# Patient Record
Sex: Female | Born: 1980 | Race: White | Hispanic: No | Marital: Married | Smoking: Former smoker
Health system: Southern US, Community
[De-identification: ages and names within clinical notes are randomized; demographics above are authoritative.]

## PROBLEM LIST (undated history)

## (undated) DIAGNOSIS — Z87440 Personal history of urinary (tract) infections: Secondary | ICD-10-CM

## (undated) DIAGNOSIS — N200 Calculus of kidney: Secondary | ICD-10-CM

## (undated) HISTORY — DX: Calculus of kidney: N20.0

## (undated) HISTORY — PX: LEEP: SHX91

## (undated) HISTORY — PX: DILATION AND CURETTAGE OF UTERUS: SHX78

## (undated) HISTORY — DX: Personal history of urinary (tract) infections: Z87.440

## (undated) HISTORY — PX: WISDOM TOOTH EXTRACTION: SHX21

---

## 2004-04-22 ENCOUNTER — Encounter (INDEPENDENT_AMBULATORY_CARE_PROVIDER_SITE_OTHER): Payer: Self-pay | Admitting: *Deleted

## 2004-04-22 ENCOUNTER — Ambulatory Visit (HOSPITAL_COMMUNITY): Admission: RE | Admit: 2004-04-22 | Discharge: 2004-04-22 | Payer: Self-pay | Admitting: Gynecology

## 2004-11-15 ENCOUNTER — Ambulatory Visit: Payer: Self-pay

## 2005-09-07 ENCOUNTER — Ambulatory Visit: Payer: Self-pay | Admitting: Obstetrics & Gynecology

## 2005-09-07 ENCOUNTER — Encounter: Payer: Self-pay | Admitting: Obstetrics & Gynecology

## 2005-10-05 ENCOUNTER — Ambulatory Visit: Payer: Self-pay | Admitting: Obstetrics & Gynecology

## 2005-10-06 ENCOUNTER — Emergency Department: Payer: Self-pay

## 2005-10-09 ENCOUNTER — Ambulatory Visit: Payer: Self-pay | Admitting: Gynecology

## 2005-11-02 ENCOUNTER — Ambulatory Visit: Payer: Self-pay | Admitting: Obstetrics & Gynecology

## 2005-11-23 ENCOUNTER — Ambulatory Visit (HOSPITAL_COMMUNITY): Admission: RE | Admit: 2005-11-23 | Discharge: 2005-11-23 | Payer: Self-pay | Admitting: Gynecology

## 2005-12-05 ENCOUNTER — Ambulatory Visit: Payer: Self-pay | Admitting: Family Medicine

## 2006-01-02 ENCOUNTER — Ambulatory Visit: Payer: Self-pay | Admitting: Family Medicine

## 2006-01-30 ENCOUNTER — Ambulatory Visit: Payer: Self-pay | Admitting: Family Medicine

## 2006-02-13 ENCOUNTER — Ambulatory Visit: Payer: Self-pay | Admitting: Family Medicine

## 2006-02-14 ENCOUNTER — Ambulatory Visit: Payer: Self-pay | Admitting: Family Medicine

## 2006-02-27 ENCOUNTER — Ambulatory Visit: Payer: Self-pay | Admitting: Family Medicine

## 2006-03-26 ENCOUNTER — Ambulatory Visit: Payer: Self-pay | Admitting: Family Medicine

## 2006-04-03 ENCOUNTER — Ambulatory Visit: Payer: Self-pay | Admitting: Gynecology

## 2006-04-10 ENCOUNTER — Ambulatory Visit: Payer: Self-pay | Admitting: Family Medicine

## 2006-04-17 ENCOUNTER — Ambulatory Visit: Payer: Self-pay | Admitting: Family Medicine

## 2006-04-24 ENCOUNTER — Ambulatory Visit: Payer: Self-pay | Admitting: Family Medicine

## 2006-04-24 ENCOUNTER — Ambulatory Visit (HOSPITAL_COMMUNITY): Admission: RE | Admit: 2006-04-24 | Discharge: 2006-04-24 | Payer: Self-pay | Admitting: Family Medicine

## 2006-04-27 ENCOUNTER — Ambulatory Visit: Payer: Self-pay | Admitting: Family Medicine

## 2006-04-29 ENCOUNTER — Inpatient Hospital Stay (HOSPITAL_COMMUNITY): Admission: AD | Admit: 2006-04-29 | Discharge: 2006-04-30 | Payer: Self-pay | Admitting: Gynecology

## 2006-04-29 ENCOUNTER — Ambulatory Visit: Payer: Self-pay | Admitting: Gynecology

## 2006-06-04 ENCOUNTER — Encounter (INDEPENDENT_AMBULATORY_CARE_PROVIDER_SITE_OTHER): Payer: Self-pay | Admitting: Gynecology

## 2006-06-04 ENCOUNTER — Ambulatory Visit: Payer: Self-pay | Admitting: Gynecology

## 2006-10-04 ENCOUNTER — Ambulatory Visit: Payer: Self-pay | Admitting: Family Medicine

## 2008-02-03 ENCOUNTER — Ambulatory Visit: Payer: Self-pay | Admitting: Obstetrics and Gynecology

## 2008-02-03 ENCOUNTER — Encounter: Payer: Self-pay | Admitting: Obstetrics and Gynecology

## 2008-03-02 ENCOUNTER — Encounter: Payer: Self-pay | Admitting: Family Medicine

## 2008-03-02 ENCOUNTER — Ambulatory Visit: Payer: Self-pay | Admitting: Obstetrics and Gynecology

## 2008-03-02 LAB — CONVERTED CEMR LAB
ALT: 10 units/L (ref 0–35)
AST: 13 units/L (ref 0–37)
Albumin: 4.4 g/dL (ref 3.5–5.2)
BUN: 11 mg/dL (ref 6–23)
Chloride: 103 meq/L (ref 96–112)
Cholesterol: 121 mg/dL (ref 0–200)
Creatinine, Ser: 0.64 mg/dL (ref 0.40–1.20)
Hemoglobin: 13.3 g/dL (ref 12.0–15.0)
LDL Cholesterol: 62 mg/dL (ref 0–99)
MCV: 90 fL (ref 78.0–100.0)
RBC: 4.72 M/uL (ref 3.87–5.11)
RDW: 12.8 % (ref 11.5–15.5)
Sodium: 141 meq/L (ref 135–145)
Total Bilirubin: 0.5 mg/dL (ref 0.3–1.2)
Total CHOL/HDL Ratio: 2.9
Triglycerides: 87 mg/dL (ref <150)
VLDL: 17 mg/dL (ref 0–40)
WBC: 5.7 10*3/uL (ref 4.0–10.5)

## 2008-05-07 ENCOUNTER — Encounter: Payer: Self-pay | Admitting: Family Medicine

## 2008-05-07 ENCOUNTER — Ambulatory Visit: Payer: Self-pay | Admitting: Obstetrics & Gynecology

## 2008-05-07 LAB — CONVERTED CEMR LAB
Basophils Absolute: 0 10*3/uL (ref 0.0–0.1)
Hemoglobin: 12.9 g/dL (ref 12.0–15.0)
Lymphs Abs: 1.8 10*3/uL (ref 0.7–4.0)
MCHC: 34.6 g/dL (ref 30.0–36.0)
MCV: 84.8 fL (ref 78.0–100.0)
Monocytes Relative: 4 % (ref 3–12)
RBC: 4.4 M/uL (ref 3.87–5.11)
RDW: 12.7 % (ref 11.5–15.5)
WBC: 10.1 10*3/uL (ref 4.0–10.5)

## 2008-05-13 ENCOUNTER — Ambulatory Visit (HOSPITAL_COMMUNITY): Admission: RE | Admit: 2008-05-13 | Discharge: 2008-05-13 | Payer: Self-pay | Admitting: Family Medicine

## 2008-05-26 ENCOUNTER — Ambulatory Visit: Payer: Self-pay | Admitting: Obstetrics & Gynecology

## 2008-05-26 ENCOUNTER — Encounter: Payer: Self-pay | Admitting: Family Medicine

## 2008-05-26 LAB — CONVERTED CEMR LAB
Chlamydia, DNA Probe: NEGATIVE
GC Probe Amp, Genital: NEGATIVE

## 2008-05-28 ENCOUNTER — Ambulatory Visit: Payer: Self-pay | Admitting: Obstetrics and Gynecology

## 2008-06-17 ENCOUNTER — Ambulatory Visit: Payer: Self-pay | Admitting: Obstetrics & Gynecology

## 2008-06-24 ENCOUNTER — Ambulatory Visit (HOSPITAL_COMMUNITY): Admission: RE | Admit: 2008-06-24 | Discharge: 2008-06-24 | Payer: Self-pay | Admitting: Obstetrics and Gynecology

## 2008-07-15 ENCOUNTER — Ambulatory Visit: Payer: Self-pay | Admitting: Family Medicine

## 2008-08-13 ENCOUNTER — Ambulatory Visit: Payer: Self-pay | Admitting: Obstetrics & Gynecology

## 2008-08-27 ENCOUNTER — Encounter: Payer: Self-pay | Admitting: Family Medicine

## 2008-08-27 ENCOUNTER — Ambulatory Visit: Payer: Self-pay | Admitting: Obstetrics and Gynecology

## 2008-08-27 LAB — CONVERTED CEMR LAB
HCT: 33.7 % — ABNORMAL LOW (ref 36.0–46.0)
RDW: 14.2 % (ref 11.5–15.5)
WBC: 11.8 10*3/uL — ABNORMAL HIGH (ref 4.0–10.5)

## 2008-09-09 ENCOUNTER — Encounter: Payer: Self-pay | Admitting: Family Medicine

## 2008-09-10 ENCOUNTER — Ambulatory Visit: Payer: Self-pay | Admitting: Nurse Practitioner

## 2008-09-16 ENCOUNTER — Ambulatory Visit: Payer: Self-pay | Admitting: Obstetrics and Gynecology

## 2008-10-01 ENCOUNTER — Ambulatory Visit: Payer: Self-pay | Admitting: Obstetrics and Gynecology

## 2008-10-05 ENCOUNTER — Ambulatory Visit: Payer: Self-pay | Admitting: Obstetrics & Gynecology

## 2008-10-15 ENCOUNTER — Ambulatory Visit: Payer: Self-pay | Admitting: Obstetrics & Gynecology

## 2008-10-28 ENCOUNTER — Ambulatory Visit: Payer: Self-pay | Admitting: Obstetrics & Gynecology

## 2008-10-28 ENCOUNTER — Encounter (INDEPENDENT_AMBULATORY_CARE_PROVIDER_SITE_OTHER): Payer: Self-pay | Admitting: *Deleted

## 2008-11-05 ENCOUNTER — Ambulatory Visit: Payer: Self-pay | Admitting: Obstetrics & Gynecology

## 2008-11-11 ENCOUNTER — Ambulatory Visit: Payer: Self-pay | Admitting: Family Medicine

## 2008-11-16 ENCOUNTER — Ambulatory Visit: Payer: Self-pay | Admitting: Family Medicine

## 2008-11-19 ENCOUNTER — Inpatient Hospital Stay (HOSPITAL_COMMUNITY): Admission: AD | Admit: 2008-11-19 | Discharge: 2008-11-22 | Payer: Self-pay | Admitting: Family Medicine

## 2008-11-19 ENCOUNTER — Ambulatory Visit: Payer: Self-pay | Admitting: Family

## 2008-12-31 ENCOUNTER — Ambulatory Visit: Payer: Self-pay | Admitting: Obstetrics & Gynecology

## 2009-11-17 ENCOUNTER — Ambulatory Visit: Payer: Self-pay | Admitting: Obstetrics & Gynecology

## 2010-06-24 LAB — CBC
HCT: 27.6 % — ABNORMAL LOW (ref 36.0–46.0)
HCT: 31.1 % — ABNORMAL LOW (ref 36.0–46.0)
Hemoglobin: 10.5 g/dL — ABNORMAL LOW (ref 12.0–15.0)
MCHC: 33.8 g/dL (ref 30.0–36.0)
MCV: 82.8 fL (ref 78.0–100.0)
MCV: 83.3 fL (ref 78.0–100.0)
Platelets: 260 10*3/uL (ref 150–400)
Platelets: 292 10*3/uL (ref 150–400)
RBC: 3.74 MIL/uL — ABNORMAL LOW (ref 3.87–5.11)
RDW: 15 % (ref 11.5–15.5)

## 2010-06-24 LAB — URINALYSIS, ROUTINE W REFLEX MICROSCOPIC
Glucose, UA: NEGATIVE mg/dL
Urobilinogen, UA: 0.2 mg/dL (ref 0.0–1.0)
pH: 5.5 (ref 5.0–8.0)

## 2010-06-24 LAB — URINE CULTURE: Special Requests: NEGATIVE

## 2010-06-24 LAB — CULTURE, BLOOD (ROUTINE X 2)
Culture: NO GROWTH
Culture: NO GROWTH

## 2010-06-24 LAB — RPR: RPR Ser Ql: NONREACTIVE

## 2010-06-24 LAB — URINE MICROSCOPIC-ADD ON

## 2010-08-02 NOTE — Assessment & Plan Note (Signed)
[name], [name]               ACCOUNT NO.:  [id number]   MEDICAL RECORD NO.:  [id number]          PATIENT TYPE:  POB   LOCATION:  [facility] at [facility]         FACILITY:  [facility]   PHYSICIAN:  Jaynie Collins, MD     DATE OF BIRTH:  11-28-1980   DATE OF SERVICE:  11/17/2009                                  CLINIC NOTE   REASON FOR VISIT:  Annual examination.   [name] is a 30 year old gravida 4, para 2-0-2-2 Caucasian female,  who is here today for her annual gynecologic examination.  The patient  has no concerns.  She denies any abnormal bleeding, vaginal discharge,  or any other gynecologic symptoms.   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  She has had uncomplicated  pregnancy.  On her gynecologic history use, the patient had had a LEEP  when she was 30 years old and since then has had normal Paps.  Her last  Pap smear was on February 03, 2008.  She has normal menstrual periods  which are regular.  Her first day of her last menstrual period was  October 23, 2009.  The patient is preparing to start trying to get  pregnant again.  She is currently on prenatal vitamins and is not on or  been exposed to any teratogenic agents.  She was using condoms for  contraception.   PAST MEDICAL HISTORY:  Obesity.   PAST SURGICAL HISTORY:  D and C x2 and LEEP.   MEDICATIONS:  Prenatal vitamins.   ALLERGIES:  No known drug allergies.   SOCIAL HISTORY:  The patient works as a Administrator, sports.  She denies any  tobacco, alcohol, or illicit drug use.   FAMILY HISTORY:  Mother has Parkinson.  Father had blood pressure  problems.  No gynecologic cancers.   REVIEW OF SYSTEMS:  Entirely negative.   PHYSICAL EXAMINATION:  VITAL SIGNS:  Pulse is 62, blood pressure 101/71,  weight 192 pounds, height is 5 feet 7 inches.  GENERAL:  No apparent distress.  HEENT:  Normocephalic, atraumatic.  NECK:  Supple.  No masses.   Normal thyroid.  BREASTS:  Soft, symmetric, nontender.  No abnormal masses, skin changes,  nipple drainage, or lymphadenopathy.  LUNGS:  Clear to auscultation bilaterally.  HEART:  Regular rate and rhythm.  ABDOMEN:  Soft, nontender, nondistended.  No organomegaly.  EXTREMITIES:  No cyanosis, clubbing, or edema.  PELVIC:  Normal external female genitalia.  Pink, well-rugated vagina.  Normal discharge.  Multiparous cervix.  Pap smear sample was obtained on  bimanual exam.  The patient does have a small mobile uterus.  No  tenderness and normal adnexa bilaterally.   ASSESSMENT AND PLAN:  The patient is a 30 year old gravida 4, para 2-0-2-  2 here for annual examination.  The patient has no gynecologic concerns.  Her Pap smear was done today.  We will follow up on the results.  The  patient was told to call back if she has any further gynecologic issues  or when she gets pregnant to establish her prenatal care.  Of note, the  patient is interested  in having a lipid panel checked.  She was told to  come back to the office when she is fasting to have this performed.           ______________________________  Jaynie Collins, MD     UA/MEDQ  D:  11/17/2009  T:  11/18/2009  Job:  [id number]

## 2010-08-02 NOTE — Assessment & Plan Note (Signed)
[name], [name]               ACCOUNT NO.:  [id number]   MEDICAL RECORD NO.:  [id number]          PATIENT TYPE:  POB   LOCATION:  [facility] at [facility]         FACILITY:  [facility]   PHYSICIAN:  Argentina Donovan, MD        DATE OF BIRTH:  12-21-1980   DATE OF SERVICE:  02/03/2008                                  CLINIC NOTE   The patient is a 30 year old Caucasian female gravida 1, para 1-0-0-1  with child a year and a half old, in for an annual wellness exam.  She  is on no medications except prenatal vitamins.   PHYSICAL EXAMINATION:  VITAL SIGNS:  Her height is 5 feet 6, she weighs  199 pounds.  Her blood pressure is 113/72 with a pulse of 66 and  temperature of 98.7.  HEENT:  PERRLA, normocephalic, within normal limits.  NECK:  Supple.  Thyroid symmetrical with no masses.  BACK:  Erect.  LUNGS:  Clear to auscultation and percussion.  HEART:  No murmur.  Normal sinus rhythm.  PMI in fifth intercostal space  and midclavicular line.  BREASTS:  Symmetrical with no dominant masses.  No nipple discharge.  ABDOMEN:  Soft, flat, nontender.  No masses or organomegaly.  EXTERNAL GENITALIA:  Normal.  BUS within normal limits.  The vagina is  clean and well rugated.  The cervix is clean and parous and the uterus  is of normal size, shape, consistency.  The adnexa could not be well  outlined because of the habitus of the patient.  RECTUM:  No masses.  EXTREMITIES:  No edema.  No varices.  NEUROLOGIC:  DTRs within normal limits.   IMPRESSION:  Normal physical examination.   PLAN:  To get a CMET, CBC, and urinalysis, and the patient will return  to that in the next few days because she has already eaten today and we  wanted to get some lipid profile.           ______________________________  Argentina Donovan, MD     PR/MEDQ  D:  02/03/2008  T:  02/04/2008  Job:  [id number]

## 2010-08-05 NOTE — Op Note (Signed)
NAMESEVILLA, [name]                ACCOUNT NO.:  [id number]   MEDICAL RECORD NO.:  [id number]          PATIENT TYPE:  AMB   LOCATION:  [facility]                           FACILITY:  [facility]   PHYSICIAN:  Ginger Carne, MD  DATE OF BIRTH:  10-Sep-1980   DATE OF PROCEDURE:  04/22/2004  DATE OF DISCHARGE:                                 OPERATIVE REPORT   PREOPERATIVE DIAGNOSIS:  First trimester missed abortion.   POSTOPERATIVE DIAGNOSIS:  First trimester missed abortion.   PROCEDURE:  Aspiration, dilatation and curettage.   SURGEON:  Ginger Carne, M.D.   ASSISTANT:  None.   COMPLICATIONS:  None immediate.   ESTIMATED BLOOD LOSS:  Negligible.   SPECIMENS:  Products of conception.   ANESTHESIA:  Local.   FINDINGS:  The uterus was about eight weeks in size consistent with  transvaginal ultrasound findings.  She is Rh positive.  Products of  conception noted in an enlarged cavity.  Both adnexa palpable and found to  be normal.  External genitalia, vulva, and vagina normal as well.  A #7  suction curet was utilized for the procedure followed by sharp and then  suction curettage.   DESCRIPTION OF PROCEDURE:  The patient was prepped and draped in the usual  fashion and placed in the lithotomy position.  Betadine solution used for  antiseptic and she was not catheterized.  After adequate sedation and  without general anesthetic, 10 mL of Marcaine was injected circumferentially  around the cervix.  Single tooth tenaculum placed on the anterior lip.  Dilatation to accommodate a #7 suction curettage followed by sharp and then  suction curettage followed.  The cavity was empty at the end of the  procedure.  The patient tolerated the procedure well and returned to the  postanesthesia recovery room in excellent condition.      SHB/MEDQ  D:  04/22/2004  T:  04/22/2004  Job:  [id number]

## 2010-08-05 NOTE — H&P (Signed)
NAMEJUDIT, [name]                ACCOUNT NO.:  [id number]   MEDICAL RECORD NO.:  [id number]           PATIENT TYPE:   LOCATION:                                 FACILITY:   PHYSICIAN:  Ginger Carne, MD  DATE OF BIRTH:  1980/06/12   DATE OF ADMISSION:  DATE OF DISCHARGE:                                HISTORY & PHYSICAL   ADMITTING DIAGNOSIS:  Missed abortion, first trimester.   IN-HOSPITAL PROCEDURE:  Aspiration dilatation and curettage.   HISTORY OF PRESENT ILLNESS:  This patient is a 30 year old gravida 1 para 38  Caucasian female; [facility] October 23, 2004; [redacted] weeks gestation; with a missed  abortion noted on transvaginal ultrasound today.  The patient was  complaining of minima spotting last evening and this morning when she  arrived to the office in late morning of April 21, 2004 ultrasound  confirmed no fetal heart rate with a measurement of about 8 weeks.  The  patient has had no other complaints.   OBSTETRICAL/GYNECOLOGICAL HISTORY:  She is O positive, rubella immune.  LMP  was uncertain.  Ultrasound on March 08, 2004 at 6-and-a-half weeks  confirmed her due date of October 23, 2004.   ALLERGIES:  None.   CURRENT MEDICAL PROBLEMS:  The patient is being treated for active  bronchitis for which she is taking Hycotuss and Z-Pak.   CURRENT MEDICATIONS:  Prenatal vitamins, Z-Pak, and Hycotuss.   SOCIAL HISTORY:  Negative for smoking, illicit drug abuse, or alcohol.   SURGICAL HISTORY:  Negative.   FAMILY HISTORY:  Negative for breast, colon, ovarian, or uterine carcinoma.   REVIEW OF SYSTEMS:  Negative.   PHYSICAL EXAMINATION:  GENERAL:  Normal-appearing female, no acute distress.  VITAL SIGNS:  Height 5 feet 6 inches weight 190 pounds.  Blood pressure  120/78.  HEENT:  Grossly normal.  BREAST:  Without masses, discharge, thickenings, or tenderness.  CHEST:  Clear to percussion and auscultation.  CARDIOVASCULAR:  Without murmurs or enlargements, regular rate and  rhythm.  EXTREMITY, LYMPHATIC, SKIN, NEUROLOGIC, MUSCULOSKELETAL:  All within normal  limits.  ABDOMEN:  Soft without gross hepatosplenomegaly.  PELVIC:  External genitalia, vulva, vagina normal.  Cervix smooth without  erosions or lesions.  Uterus palpates to about 8 weeks and confirmed by  ultrasound.  Both adnexa palpable, found to be normal.   IMPRESSION:  Transvaginal ultrasound diagnosis of first trimester missed  abortion.   PLAN:  Aspiration dilatation and curettage.      SHB/MEDQ  D:  04/21/2004  T:  04/21/2004  Job:  [id number]

## 2010-09-06 ENCOUNTER — Ambulatory Visit (INDEPENDENT_AMBULATORY_CARE_PROVIDER_SITE_OTHER): Payer: Self-pay | Admitting: Obstetrics & Gynecology

## 2010-09-06 DIAGNOSIS — Z30430 Encounter for insertion of intrauterine contraceptive device: Secondary | ICD-10-CM

## 2010-10-04 ENCOUNTER — Encounter: Payer: Self-pay | Admitting: Family Medicine

## 2011-06-06 ENCOUNTER — Encounter: Payer: Self-pay | Admitting: Family Medicine

## 2011-06-06 ENCOUNTER — Ambulatory Visit (INDEPENDENT_AMBULATORY_CARE_PROVIDER_SITE_OTHER): Payer: BC Managed Care – PPO | Admitting: Family Medicine

## 2011-06-06 ENCOUNTER — Ambulatory Visit: Payer: Self-pay | Admitting: Family Medicine

## 2011-06-06 VITALS — BP 106/76 | HR 65 | Ht 67.0 in | Wt 201.0 lb

## 2011-06-06 DIAGNOSIS — Z30432 Encounter for removal of intrauterine contraceptive device: Secondary | ICD-10-CM

## 2011-06-06 NOTE — Progress Notes (Signed)
History Wants IUD removed, has had weight gain and cramping.  May want more kids, husband may get vasectomy. Physical exam Filed Vitals:   06/06/11 1449  BP: 106/76  Pulse: 65  GU-NEFG, BUS WNL, Vagina pink and ruggated, Cervix without lesion, IUD strings visualized and removed easily   Assessment IUD removal  Plan See AVS

## 2011-06-06 NOTE — Patient Instructions (Signed)
Preparing for Pregnancy Preparing for pregnancy (preconceptual care) by getting counseling and information from your caregiver before getting pregnant is a good idea. It will help you and your baby have a better chance to have a healthy, safe pregnancy and delivery of your baby. Make an appointment with your caregiver to talk about your health, medical, and family history and how to prepare yourself before getting pregnant. Your caregiver will do a complete physical exam and a Pap test. They will want to know:  About you, your spouse or partner, and your family's medical and genetic history.   If you are eating a balanced diet and drinking enough fluids.   What vitamins and mineral supplements you are taking. This includes taking folic acid before getting pregnant to help prevent birth defects.   What medications you are taking including prescription, over-the-counter and herbal medications.   If there is any substance abuse like alcohol, smoking, and illegal drugs.   If there is any mental or physical domestic violence.   If there is any risk of sexually transmitted disease between you and your partner.   What immunizations and vaccinations you have had and what you may need before getting pregnant.   If you should get tested for HIV infection.   If there is any exposure to chemical or toxic substances at home or work.   If there are medical problems you have that need to be treated and kept under control before getting pregnant such as diabetes, high blood pressure or others.   If there were any past surgeries, pregnancies and problems with them.   What your current weight is and to set a goal as to how much weight you should gain while pregnant. Also, they will check if you should lose or gain weight before getting pregnant.   What is your exercise routine and what it is safe when you are pregnant.   If there are any physical disabilities that need to be addressed.   About spacing  your pregnancies when there are other children.   If there is a financial problem that may affect you having a child.  After talking about the above points with your caregiver, your caregiver will give you advice on how to help treat and work with you on solving any issues, if necessary, before getting pregnant. The goal is to have a healthy and safe pregnancy for you and your baby. You should keep an accurate record of your menstrual periods because it will help in determining your due date. Immunizations that you should have before getting pregnant:   Regular measles, German measles (rubella) and mumps.   Tetanus and diphtheria.   Chickenpox, if not immune.   Herpes zoster (Varicella) if not immune.   Human papilloma virus vaccine (HPV) between the age of 9 and 26 years old.   Hepatitis A vaccine.   Hepatitis B vaccine.   Influenza vaccine.   Pneumococcal vaccine (pneumonia).  You should avoid getting pregnant for one month after getting vaccinated with a live virus vaccine such as German measles (rubella) vaccine. Other immunizations may be necessary depending on where you live, such as malaria. Ask your caregiver if any other immunizations are needed for you. HOME CARE INSTRUCTIONS   Follow the advice of your caregiver.   Before getting pregnant:   Begin taking vitamins, supplements, and 0.4 milligrams folic acid daily.   Get your immunizations up-to-date.   Get help from a nutrition counselor if you do not understand what   a balanced diet is, need help with a special medical diet or if you need help to lose or gain weight.   Begin exercising.   Stop smoking, taking illegal drugs, and drinking alcoholic beverages.   Get counseling if there is and type of domestic violence.   Get checked for sexually transmitted diseases including HIV.   Get any medical problems under control (diabetes, high blood pressure, convulsions, asthma or others).   Resolve any financial  concerns.   Be sure you and your spouse or partner are ready to have a baby.   Keep an accurate record of your menstrual periods.  Document Released: 02/17/2008 Document Revised: 02/23/2011 Document Reviewed: 02/17/2008 [employer] Patient Information 2012 [employer]. 

## 2011-10-10 ENCOUNTER — Other Ambulatory Visit (INDEPENDENT_AMBULATORY_CARE_PROVIDER_SITE_OTHER): Payer: BC Managed Care – PPO | Admitting: *Deleted

## 2011-10-10 DIAGNOSIS — Z87440 Personal history of urinary (tract) infections: Secondary | ICD-10-CM

## 2011-10-10 DIAGNOSIS — N390 Urinary tract infection, site not specified: Secondary | ICD-10-CM

## 2011-10-10 LAB — POCT URINALYSIS DIPSTICK
Bilirubin, UA: NEGATIVE
Blood, UA: NEGATIVE
Glucose, UA: NEGATIVE
Ketones, UA: NEGATIVE
Leukocytes, UA: NEGATIVE
Protein, UA: NEGATIVE
Spec Grav, UA: 1.01
pH, UA: 5

## 2011-10-10 NOTE — Progress Notes (Signed)
For two week patient has been having increasing mid back pain.  This is typical for her when she has a uti.  Her urine dip today is negative but we will send for culture to ensure she does not have an infection.

## 2011-10-12 ENCOUNTER — Encounter: Payer: Self-pay | Admitting: *Deleted

## 2011-10-12 DIAGNOSIS — Z87440 Personal history of urinary (tract) infections: Secondary | ICD-10-CM | POA: Insufficient documentation

## 2011-10-12 HISTORY — DX: Personal history of urinary (tract) infections: Z87.440

## 2011-10-12 MED ORDER — CIPROFLOXACIN HCL 500 MG PO TABS: 500.0000 mg | ORAL_TABLET | Freq: Two times a day (BID) | ORAL | Status: AC

## 2011-10-12 NOTE — Addendum Note (Signed)
Addended by: Jaynie Collins A on: 10/12/2011 08:59 AM   Modules accepted: Orders

## 2011-10-12 NOTE — Progress Notes (Signed)
Urine culture shows >100K E coli. Ciprofloxacin ordered. Patient will be called to inform her of diagnosis and to pick up prescription.

## 2011-10-13 LAB — CULTURE, URINE COMPREHENSIVE

## 2012-02-28 ENCOUNTER — Telehealth: Payer: Self-pay | Admitting: *Deleted

## 2012-02-28 DIAGNOSIS — J329 Chronic sinusitis, unspecified: Secondary | ICD-10-CM

## 2012-02-28 MED ORDER — DOXYCYCLINE HYCLATE 100 MG PO TABS: 100.0000 mg | ORAL_TABLET | Freq: Two times a day (BID) | ORAL | Status: DC

## 2012-02-28 NOTE — Telephone Encounter (Signed)
Patients entire family has been diagnosed with sinus infection, patient would like to have antibiotic called in that the rest of the family received because she is not able to leave her kids right now.

## 2012-05-06 ENCOUNTER — Telehealth: Payer: Self-pay | Admitting: *Deleted

## 2012-05-06 DIAGNOSIS — N390 Urinary tract infection, site not specified: Secondary | ICD-10-CM

## 2012-05-06 MED ORDER — CIPROFLOXACIN HCL 500 MG PO TABS: 500.0000 mg | ORAL_TABLET | Freq: Two times a day (BID) | ORAL | Status: DC

## 2012-05-06 MED ORDER — PHENAZOPYRIDINE HCL 200 MG PO TABS: 200.0000 mg | ORAL_TABLET | Freq: Three times a day (TID) | ORAL | Status: DC | PRN

## 2012-05-06 NOTE — Telephone Encounter (Signed)
Patient has uti and is having increase frequency, spasm, and pain with urination.  She would like to have something called in as she is unable to drive right now.  Meds are called in and patient will call back if her symptoms continue to be seen for an appointment.  She does not have Insurance right now so it would be hard for her to come in.

## 2012-05-06 NOTE — Telephone Encounter (Signed)
Patients pharmacy is having problems with there system and is unable to fill her prescriptions, she would like them called to [facility]

## 2012-05-14 ENCOUNTER — Telehealth: Payer: Self-pay | Admitting: *Deleted

## 2012-05-14 DIAGNOSIS — N390 Urinary tract infection, site not specified: Secondary | ICD-10-CM

## 2012-05-14 MED ORDER — SULFAMETHOXAZOLE-TRIMETHOPRIM 800-160 MG PO TABS: 1.0000 | ORAL_TABLET | Freq: Two times a day (BID) | ORAL | Status: DC

## 2012-05-14 NOTE — Telephone Encounter (Signed)
Patient took Cipro and felt better but now feels like it is coming back, she would like to take something different to see if this will knock it out.

## 2012-12-24 ENCOUNTER — Other Ambulatory Visit: Payer: BC Managed Care – PPO | Admitting: *Deleted

## 2012-12-24 DIAGNOSIS — N390 Urinary tract infection, site not specified: Secondary | ICD-10-CM

## 2012-12-24 MED ORDER — PHENAZOPYRIDINE HCL 200 MG PO TABS: 200.0000 mg | ORAL_TABLET | Freq: Three times a day (TID) | ORAL | Status: DC | PRN

## 2012-12-24 MED ORDER — CIPROFLOXACIN HCL 500 MG PO TABS: 500.0000 mg | ORAL_TABLET | Freq: Two times a day (BID) | ORAL | Status: DC

## 2012-12-24 NOTE — Progress Notes (Signed)
Patient is having pain and burning with urination.  She has a history of UTI 2-3 times a year.  She currently does not have insurance and is limited on funds to be able to come in for an office visit.  Meds will be called into pharmacy for patient and urine was sent for culture to ensure we are treating appropriately.  She will call us back if her symptoms persist.

## 2012-12-24 NOTE — Progress Notes (Signed)
Medication has been reordered to pharmacy of patients choice.

## 2012-12-24 NOTE — Addendum Note (Signed)
Addended by: Barbara Cower on: 12/24/2012 12:22 PM   Modules accepted: Orders

## 2012-12-25 LAB — URINE CULTURE: Organism ID, Bacteria: NO GROWTH

## 2013-12-31 ENCOUNTER — Ambulatory Visit (INDEPENDENT_AMBULATORY_CARE_PROVIDER_SITE_OTHER): Payer: Commercial Managed Care - PPO | Admitting: Family Medicine

## 2013-12-31 ENCOUNTER — Encounter: Payer: Self-pay | Admitting: Family Medicine

## 2013-12-31 VITALS — BP 119/85 | HR 67 | Ht 67.0 in | Wt 205.0 lb

## 2013-12-31 DIAGNOSIS — Z01419 Encounter for gynecological examination (general) (routine) without abnormal findings: Secondary | ICD-10-CM

## 2013-12-31 DIAGNOSIS — Z124 Encounter for screening for malignant neoplasm of cervix: Secondary | ICD-10-CM

## 2013-12-31 DIAGNOSIS — Z1151 Encounter for screening for human papillomavirus (HPV): Secondary | ICD-10-CM

## 2013-12-31 NOTE — Progress Notes (Signed)
  Subjective:     [name] is a 33 y.o. female and is here for a comprehensive physical exam. The patient reports problems - 2 lumps in vaginal area x 2 wks. They are non-tender.  She is beginning to work on trying to have another baby.  History   Social History  . Marital Status: Married    Spouse Name: N/A    Number of Children: N/A  . Years of Education: N/A   Occupational History  . Not on file.   Social History Main Topics  . Smoking status: Current Some Day Smoker  . Smokeless tobacco: Never Used  . Alcohol Use: Yes     Comment: socially  . Drug Use: No  . Sexual Activity: Yes    Partners: Male   Other Topics Concern  . Not on file   Social History Narrative  . No narrative on file   Health Maintenance  Topic Date Due  . Pap Smear  01/13/1997  . Tetanus/tdap  01/13/1998  . Influenza Vaccine  10/18/2013    The following portions of the patient's history were reviewed and updated as appropriate: allergies, current medications, past family history, past medical history, past social history, past surgical history and problem list.  Review of Systems A comprehensive review of systems was negative.   Objective:    BP 119/85  Pulse 67  Ht 5\' 7"  (1.702 m)  Wt 205 lb (92.987 kg)  BMI 32.10 kg/m2  LMP 12/22/2013 General appearance: alert, cooperative and appears stated age Head: Normocephalic, without obvious abnormality, atraumatic Neck: no adenopathy, supple, symmetrical, trachea midline and thyroid not enlarged, symmetric, no tenderness/mass/nodules Lungs: clear to auscultation bilaterally Breasts: normal appearance, no masses or tenderness Heart: regular rate and rhythm, S1, S2 normal, no murmur, click, rub or gallop Abdomen: soft, non-tender; bowel sounds normal; no masses,  no organomegaly Pelvic: cervix normal in appearance, no adnexal masses or tenderness, no cervical motion tenderness, uterus normal size, shape, and consistency, vagina normal  without discharge and small inclusion cysts noted on labia minora Extremities: extremities normal, atraumatic, no cyanosis or edema Pulses: 2+ and symmetric Skin: Skin color, texture, turgor normal. No rashes or lesions Lymph nodes: Cervical, supraclavicular, and axillary nodes normal. Neurologic: Grossly normal    Assessment:    Healthy female exam.      Plan:      Problem List Items Addressed This Visit   None    Visit Diagnoses   Encounter for routine gynecological examination    -  Primary    Relevant Orders       Cytology - PAP       TSH       CBC       Comprehensive metabolic panel       Lipid panel    Screening for malignant neoplasm of cervix           See After Visit Summary for Counseling Recommendations

## 2013-12-31 NOTE — Patient Instructions (Signed)
Preventive Care for Adults A healthy lifestyle and preventive care can promote health and wellness. Preventive health guidelines for women include the following key practices.  A routine yearly physical is a good way to check with your health care provider about your health and preventive screening. It is a chance to share any concerns and updates on your health and to receive a thorough exam.  Visit your dentist for a routine exam and preventive care every 6 months. Brush your teeth twice a day and floss once a day. Good oral hygiene prevents tooth decay and gum disease.  The frequency of eye exams is based on your age, health, family medical history, use of contact lenses, and other factors. Follow your health care provider's recommendations for frequency of eye exams.  Eat a healthy diet. Foods like vegetables, fruits, whole grains, low-fat dairy products, and lean protein foods contain the nutrients you need without too many calories. Decrease your intake of foods high in solid fats, added sugars, and salt. Eat the right amount of calories for you.Get information about a proper diet from your health care provider, if necessary.  Regular physical exercise is one of the most important things you can do for your health. Most adults should get at least 150 minutes of moderate-intensity exercise (any activity that increases your heart rate and causes you to sweat) each week. In addition, most adults need muscle-strengthening exercises on 2 or more days a week.  Maintain a healthy weight. The body mass index (BMI) is a screening tool to identify possible weight problems. It provides an estimate of body fat based on height and weight. Your health care provider can find your BMI and can help you achieve or maintain a healthy weight.For adults 20 years and older:  A BMI below 18.5 is considered underweight.  A BMI of 18.5 to 24.9 is normal.  A BMI of 25 to 29.9 is considered overweight.  A BMI of  30 and above is considered obese.  Maintain normal blood lipids and cholesterol levels by exercising and minimizing your intake of saturated fat. Eat a balanced diet with plenty of fruit and vegetables. Blood tests for lipids and cholesterol should begin at age 76 and be repeated every 5 years. If your lipid or cholesterol levels are high, you are over 50, or you are at high risk for heart disease, you may need your cholesterol levels checked more frequently.Ongoing high lipid and cholesterol levels should be treated with medicines if diet and exercise are not working.  If you smoke, find out from your health care provider how to quit. If you do not use tobacco, do not start.  Lung cancer screening is recommended for adults aged 22-80 years who are at high risk for developing lung cancer because of a history of smoking. A yearly low-dose CT scan of the lungs is recommended for people who have at least a 30-pack-year history of smoking and are a current smoker or have quit within the past 15 years. A pack year of smoking is smoking an average of 1 pack of cigarettes a day for 1 year (for example: 1 pack a day for 30 years or 2 packs a day for 15 years). Yearly screening should continue until the smoker has stopped smoking for at least 15 years. Yearly screening should be stopped for people who develop a health problem that would prevent them from having lung cancer treatment.  If you are pregnant, do not drink alcohol. If you are breastfeeding,  be very cautious about drinking alcohol. If you are not pregnant and choose to drink alcohol, do not have more than 1 drink per day. One drink is considered to be 12 ounces (355 mL) of beer, 5 ounces (148 mL) of wine, or 1.5 ounces (44 mL) of liquor.  Avoid use of street drugs. Do not share needles with anyone. Ask for help if you need support or instructions about stopping the use of drugs.  High blood pressure causes heart disease and increases the risk of  stroke. Your blood pressure should be checked at least every 1 to 2 years. Ongoing high blood pressure should be treated with medicines if weight loss and exercise do not work.  If you are 75-52 years old, ask your health care provider if you should take aspirin to prevent strokes.  Diabetes screening involves taking a blood sample to check your fasting blood sugar level. This should be done once every 3 years, after age 15, if you are within normal weight and without risk factors for diabetes. Testing should be considered at a younger age or be carried out more frequently if you are overweight and have at least 1 risk factor for diabetes.  Breast cancer screening is essential preventive care for women. You should practice "breast self-awareness." This means understanding the normal appearance and feel of your breasts and may include breast self-examination. Any changes detected, no matter how small, should be reported to a health care provider. Women in their 58s and 30s should have a clinical breast exam (CBE) by a health care provider as part of a regular health exam every 1 to 3 years. After age 16, women should have a CBE every year. Starting at age 53, women should consider having a mammogram (breast X-ray test) every year. Women who have a family history of breast cancer should talk to their health care provider about genetic screening. Women at a high risk of breast cancer should talk to their health care providers about having an MRI and a mammogram every year.  Breast cancer gene (BRCA)-related cancer risk assessment is recommended for women who have family members with BRCA-related cancers. BRCA-related cancers include breast, ovarian, tubal, and peritoneal cancers. Having family members with these cancers may be associated with an increased risk for harmful changes (mutations) in the breast cancer genes BRCA1 and BRCA2. Results of the assessment will determine the need for genetic counseling and  BRCA1 and BRCA2 testing.  Routine pelvic exams to screen for cancer are no longer recommended for nonpregnant women who are considered low risk for cancer of the pelvic organs (ovaries, uterus, and vagina) and who do not have symptoms. Ask your health care provider if a screening pelvic exam is right for you.  If you have had past treatment for cervical cancer or a condition that could lead to cancer, you need Pap tests and screening for cancer for at least 20 years after your treatment. If Pap tests have been discontinued, your risk factors (such as having a new sexual partner) need to be reassessed to determine if screening should be resumed. Some women have medical problems that increase the chance of getting cervical cancer. In these cases, your health care provider may recommend more frequent screening and Pap tests.  The HPV test is an additional test that may be used for cervical cancer screening. The HPV test looks for the virus that can cause the cell changes on the cervix. The cells collected during the Pap test can be  tested for HPV. The HPV test could be used to screen women aged 30 years and older, and should be used in women of any age who have unclear Pap test results. After the age of 30, women should have HPV testing at the same frequency as a Pap test.  Colorectal cancer can be detected and often prevented. Most routine colorectal cancer screening begins at the age of 50 years and continues through age 75 years. However, your health care provider may recommend screening at an earlier age if you have risk factors for colon cancer. On a yearly basis, your health care provider may provide home test kits to check for hidden blood in the stool. Use of a small camera at the end of a tube, to directly examine the colon (sigmoidoscopy or colonoscopy), can detect the earliest forms of colorectal cancer. Talk to your health care provider about this at age 50, when routine screening begins. Direct  exam of the colon should be repeated every 5-10 years through age 75 years, unless early forms of pre-cancerous polyps or small growths are found.  People who are at an increased risk for hepatitis B should be screened for this virus. You are considered at high risk for hepatitis B if:  You were born in a country where hepatitis B occurs often. Talk with your health care provider about which countries are considered high risk.  Your parents were born in a high-risk country and you have not received a shot to protect against hepatitis B (hepatitis B vaccine).  You have HIV or AIDS.  You use needles to inject street drugs.  You live with, or have sex with, someone who has hepatitis B.  You get hemodialysis treatment.  You take certain medicines for conditions like cancer, organ transplantation, and autoimmune conditions.  Hepatitis C blood testing is recommended for all people born from 1945 through 1965 and any individual with known risks for hepatitis C.  Practice safe sex. Use condoms and avoid high-risk sexual practices to reduce the spread of sexually transmitted infections (STIs). STIs include gonorrhea, chlamydia, syphilis, trichomonas, herpes, HPV, and human immunodeficiency virus (HIV). Herpes, HIV, and HPV are viral illnesses that have no cure. They can result in disability, cancer, and death.  You should be screened for sexually transmitted illnesses (STIs) including gonorrhea and chlamydia if:  You are sexually active and are younger than 24 years.  You are older than 24 years and your health care provider tells you that you are at risk for this type of infection.  Your sexual activity has changed since you were last screened and you are at an increased risk for chlamydia or gonorrhea. Ask your health care provider if you are at risk.  If you are at risk of being infected with HIV, it is recommended that you take a prescription medicine daily to prevent HIV infection. This is  called preexposure prophylaxis (PrEP). You are considered at risk if:  You are a heterosexual woman, are sexually active, and are at increased risk for HIV infection.  You take drugs by injection.  You are sexually active with a partner who has HIV.  Talk with your health care provider about whether you are at high risk of being infected with HIV. If you choose to begin PrEP, you should first be tested for HIV. You should then be tested every 3 months for as long as you are taking PrEP.  Osteoporosis is a disease in which the bones lose minerals and strength   with aging. This can result in serious bone fractures or breaks. The risk of osteoporosis can be identified using a bone density scan. Women ages 65 years and over and women at risk for fractures or osteoporosis should discuss screening with their health care providers. Ask your health care provider whether you should take a calcium supplement or vitamin D to reduce the rate of osteoporosis.  Menopause can be associated with physical symptoms and risks. Hormone replacement therapy is available to decrease symptoms and risks. You should talk to your health care provider about whether hormone replacement therapy is right for you.  Use sunscreen. Apply sunscreen liberally and repeatedly throughout the day. You should seek shade when your shadow is shorter than you. Protect yourself by wearing long sleeves, pants, a wide-brimmed hat, and sunglasses year round, whenever you are outdoors.  Once a month, do a whole body skin exam, using a mirror to look at the skin on your back. Tell your health care provider of new moles, moles that have irregular borders, moles that are larger than a pencil eraser, or moles that have changed in shape or color.  Stay current with required vaccines (immunizations).  Influenza vaccine. All adults should be immunized every year.  Tetanus, diphtheria, and acellular pertussis (Td, Tdap) vaccine. Pregnant women should  receive 1 dose of Tdap vaccine during each pregnancy. The dose should be obtained regardless of the length of time since the last dose. Immunization is preferred during the 27th-36th week of gestation. An adult who has not previously received Tdap or who does not know her vaccine status should receive 1 dose of Tdap. This initial dose should be followed by tetanus and diphtheria toxoids (Td) booster doses every 10 years. Adults with an unknown or incomplete history of completing a 3-dose immunization series with Td-containing vaccines should begin or complete a primary immunization series including a Tdap dose. Adults should receive a Td booster every 10 years.  Varicella vaccine. An adult without evidence of immunity to varicella should receive 2 doses or a second dose if she has previously received 1 dose. Pregnant females who do not have evidence of immunity should receive the first dose after pregnancy. This first dose should be obtained before leaving the health care facility. The second dose should be obtained 4-8 weeks after the first dose.  Human papillomavirus (HPV) vaccine. Females aged 13-26 years who have not received the vaccine previously should obtain the 3-dose series. The vaccine is not recommended for use in pregnant females. However, pregnancy testing is not needed before receiving a dose. If a female is found to be pregnant after receiving a dose, no treatment is needed. In that case, the remaining doses should be delayed until after the pregnancy. Immunization is recommended for any person with an immunocompromised condition through the age of 26 years if she did not get any or all doses earlier. During the 3-dose series, the second dose should be obtained 4-8 weeks after the first dose. The third dose should be obtained 24 weeks after the first dose and 16 weeks after the second dose.  Zoster vaccine. One dose is recommended for adults aged 60 years or older unless certain conditions are  present.  Measles, mumps, and rubella (MMR) vaccine. Adults born before 1957 generally are considered immune to measles and mumps. Adults born in 1957 or later should have 1 or more doses of MMR vaccine unless there is a contraindication to the vaccine or there is laboratory evidence of immunity to   each of the three diseases. A routine second dose of MMR vaccine should be obtained at least 28 days after the first dose for students attending postsecondary schools, health care workers, or international travelers. People who received inactivated measles vaccine or an unknown type of measles vaccine during 1963-1967 should receive 2 doses of MMR vaccine. People who received inactivated mumps vaccine or an unknown type of mumps vaccine before 1979 and are at high risk for mumps infection should consider immunization with 2 doses of MMR vaccine. For females of childbearing age, rubella immunity should be determined. If there is no evidence of immunity, females who are not pregnant should be vaccinated. If there is no evidence of immunity, females who are pregnant should delay immunization until after pregnancy. Unvaccinated health care workers born before 1957 who lack laboratory evidence of measles, mumps, or rubella immunity or laboratory confirmation of disease should consider measles and mumps immunization with 2 doses of MMR vaccine or rubella immunization with 1 dose of MMR vaccine.  Pneumococcal 13-valent conjugate (PCV13) vaccine. When indicated, a person who is uncertain of her immunization history and has no record of immunization should receive the PCV13 vaccine. An adult aged 19 years or older who has certain medical conditions and has not been previously immunized should receive 1 dose of PCV13 vaccine. This PCV13 should be followed with a dose of pneumococcal polysaccharide (PPSV23) vaccine. The PPSV23 vaccine dose should be obtained at least 8 weeks after the dose of PCV13 vaccine. An adult aged 19  years or older who has certain medical conditions and previously received 1 or more doses of PPSV23 vaccine should receive 1 dose of PCV13. The PCV13 vaccine dose should be obtained 1 or more years after the last PPSV23 vaccine dose.  Pneumococcal polysaccharide (PPSV23) vaccine. When PCV13 is also indicated, PCV13 should be obtained first. All adults aged 65 years and older should be immunized. An adult younger than age 65 years who has certain medical conditions should be immunized. Any person who resides in a nursing home or long-term care facility should be immunized. An adult smoker should be immunized. People with an immunocompromised condition and certain other conditions should receive both PCV13 and PPSV23 vaccines. People with human immunodeficiency virus (HIV) infection should be immunized as soon as possible after diagnosis. Immunization during chemotherapy or radiation therapy should be avoided. Routine use of PPSV23 vaccine is not recommended for American Indians, [place] Natives, or people younger than 65 years unless there are medical conditions that require PPSV23 vaccine. When indicated, people who have unknown immunization and have no record of immunization should receive PPSV23 vaccine. One-time revaccination 5 years after the first dose of PPSV23 is recommended for people aged 19-64 years who have chronic kidney failure, nephrotic syndrome, asplenia, or immunocompromised conditions. People who received 1-2 doses of PPSV23 before age 65 years should receive another dose of PPSV23 vaccine at age 65 years or later if at least 5 years have passed since the previous dose. Doses of PPSV23 are not needed for people immunized with PPSV23 at or after age 65 years.  Meningococcal vaccine. Adults with asplenia or persistent complement component deficiencies should receive 2 doses of quadrivalent meningococcal conjugate (MenACWY-D) vaccine. The doses should be obtained at least 2 months apart.  Microbiologists working with certain meningococcal bacteria, military recruits, people at risk during an outbreak, and people who travel to or live in countries with a high rate of meningitis should be immunized. A first-year college student up through age   21 years who is living in a residence hall should receive a dose if she did not receive a dose on or after her 16th birthday. Adults who have certain high-risk conditions should receive one or more doses of vaccine.  Hepatitis A vaccine. Adults who wish to be protected from this disease, have certain high-risk conditions, work with hepatitis A-infected animals, work in hepatitis A research labs, or travel to or work in countries with a high rate of hepatitis A should be immunized. Adults who were previously unvaccinated and who anticipate close contact with an international adoptee during the first 60 days after arrival in the [place] from a country with a high rate of hepatitis A should be immunized.  Hepatitis B vaccine. Adults who wish to be protected from this disease, have certain high-risk conditions, may be exposed to blood or other infectious body fluids, are household contacts or sex partners of hepatitis B positive people, are clients or workers in certain care facilities, or travel to or work in countries with a high rate of hepatitis B should be immunized.  Haemophilus influenzae type b (Hib) vaccine. A previously unvaccinated person with asplenia or sickle cell disease or having a scheduled splenectomy should receive 1 dose of Hib vaccine. Regardless of previous immunization, a recipient of a hematopoietic stem cell transplant should receive a 3-dose series 6-12 months after her successful transplant. Hib vaccine is not recommended for adults with HIV infection. Preventive Services / Frequency Ages 64 to 68 years  Blood pressure check.** / Every 1 to 2 years.  Lipid and cholesterol check.** / Every 5 years beginning at age  22.  Clinical breast exam.** / Every 3 years for women in their 88s and 53s.  BRCA-related cancer risk assessment.** / For women who have family members with a BRCA-related cancer (breast, ovarian, tubal, or peritoneal cancers).  Pap test.** / Every 2 years from ages 90 through 51. Every 3 years starting at age 21 through age 56 or 3 with a history of 3 consecutive normal Pap tests.  HPV screening.** / Every 3 years from ages 24 through ages 1 to 46 with a history of 3 consecutive normal Pap tests.  Hepatitis C blood test.** / For any individual with known risks for hepatitis C.  Skin self-exam. / Monthly.  Influenza vaccine. / Every year.  Tetanus, diphtheria, and acellular pertussis (Tdap, Td) vaccine.** / Consult your health care provider. Pregnant women should receive 1 dose of Tdap vaccine during each pregnancy. 1 dose of Td every 10 years.  Varicella vaccine.** / Consult your health care provider. Pregnant females who do not have evidence of immunity should receive the first dose after pregnancy.  HPV vaccine. / 3 doses over 6 months, if 72 and younger. The vaccine is not recommended for use in pregnant females. However, pregnancy testing is not needed before receiving a dose.  Measles, mumps, rubella (MMR) vaccine.** / You need at least 1 dose of MMR if you were born in 1957 or later. You may also need a 2nd dose. For females of childbearing age, rubella immunity should be determined. If there is no evidence of immunity, females who are not pregnant should be vaccinated. If there is no evidence of immunity, females who are pregnant should delay immunization until after pregnancy.  Pneumococcal 13-valent conjugate (PCV13) vaccine.** / Consult your health care provider.  Pneumococcal polysaccharide (PPSV23) vaccine.** / 1 to 2 doses if you smoke cigarettes or if you have certain conditions.  Meningococcal vaccine.** /  1 dose if you are age 19 to 21 years and a first-year college  student living in a residence hall, or have one of several medical conditions, you need to get vaccinated against meningococcal disease. You may also need additional booster doses.  Hepatitis A vaccine.** / Consult your health care provider.  Hepatitis B vaccine.** / Consult your health care provider.  Haemophilus influenzae type b (Hib) vaccine.** / Consult your health care provider. Ages 40 to 64 years  Blood pressure check.** / Every 1 to 2 years.  Lipid and cholesterol check.** / Every 5 years beginning at age 20 years.  Lung cancer screening. / Every year if you are aged 55-80 years and have a 30-pack-year history of smoking and currently smoke or have quit within the past 15 years. Yearly screening is stopped once you have quit smoking for at least 15 years or develop a health problem that would prevent you from having lung cancer treatment.  Clinical breast exam.** / Every year after age 40 years.  BRCA-related cancer risk assessment.** / For women who have family members with a BRCA-related cancer (breast, ovarian, tubal, or peritoneal cancers).  Mammogram.** / Every year beginning at age 40 years and continuing for as long as you are in good health. Consult with your health care provider.  Pap test.** / Every 3 years starting at age 30 years through age 65 or 70 years with a history of 3 consecutive normal Pap tests.  HPV screening.** / Every 3 years from ages 30 years through ages 65 to 70 years with a history of 3 consecutive normal Pap tests.  Fecal occult blood test (FOBT) of stool. / Every year beginning at age 50 years and continuing until age 75 years. You may not need to do this test if you get a colonoscopy every 10 years.  Flexible sigmoidoscopy or colonoscopy.** / Every 5 years for a flexible sigmoidoscopy or every 10 years for a colonoscopy beginning at age 50 years and continuing until age 75 years.  Hepatitis C blood test.** / For all people born from 1945 through  1965 and any individual with known risks for hepatitis C.  Skin self-exam. / Monthly.  Influenza vaccine. / Every year.  Tetanus, diphtheria, and acellular pertussis (Tdap/Td) vaccine.** / Consult your health care provider. Pregnant women should receive 1 dose of Tdap vaccine during each pregnancy. 1 dose of Td every 10 years.  Varicella vaccine.** / Consult your health care provider. Pregnant females who do not have evidence of immunity should receive the first dose after pregnancy.  Zoster vaccine.** / 1 dose for adults aged 60 years or older.  Measles, mumps, rubella (MMR) vaccine.** / You need at least 1 dose of MMR if you were born in 1957 or later. You may also need a 2nd dose. For females of childbearing age, rubella immunity should be determined. If there is no evidence of immunity, females who are not pregnant should be vaccinated. If there is no evidence of immunity, females who are pregnant should delay immunization until after pregnancy.  Pneumococcal 13-valent conjugate (PCV13) vaccine.** / Consult your health care provider.  Pneumococcal polysaccharide (PPSV23) vaccine.** / 1 to 2 doses if you smoke cigarettes or if you have certain conditions.  Meningococcal vaccine.** / Consult your health care provider.  Hepatitis A vaccine.** / Consult your health care provider.  Hepatitis B vaccine.** / Consult your health care provider.  Haemophilus influenzae type b (Hib) vaccine.** / Consult your health care provider. Ages 65   years and over  Blood pressure check.** / Every 1 to 2 years.  Lipid and cholesterol check.** / Every 5 years beginning at age 20 years.  Lung cancer screening. / Every year if you are aged 55-80 years and have a 30-pack-year history of smoking and currently smoke or have quit within the past 15 years. Yearly screening is stopped once you have quit smoking for at least 15 years or develop a health problem that would prevent you from having lung cancer  treatment.  Clinical breast exam.** / Every year after age 40 years.  BRCA-related cancer risk assessment.** / For women who have family members with a BRCA-related cancer (breast, ovarian, tubal, or peritoneal cancers).  Mammogram.** / Every year beginning at age 40 years and continuing for as long as you are in good health. Consult with your health care provider.  Pap test.** / Every 3 years starting at age 30 years through age 65 or 70 years with 3 consecutive normal Pap tests. Testing can be stopped between 65 and 70 years with 3 consecutive normal Pap tests and no abnormal Pap or HPV tests in the past 10 years.  HPV screening.** / Every 3 years from ages 30 years through ages 65 or 70 years with a history of 3 consecutive normal Pap tests. Testing can be stopped between 65 and 70 years with 3 consecutive normal Pap tests and no abnormal Pap or HPV tests in the past 10 years.  Fecal occult blood test (FOBT) of stool. / Every year beginning at age 50 years and continuing until age 75 years. You may not need to do this test if you get a colonoscopy every 10 years.  Flexible sigmoidoscopy or colonoscopy.** / Every 5 years for a flexible sigmoidoscopy or every 10 years for a colonoscopy beginning at age 50 years and continuing until age 75 years.  Hepatitis C blood test.** / For all people born from 1945 through 1965 and any individual with known risks for hepatitis C.  Osteoporosis screening.** / A one-time screening for women ages 65 years and over and women at risk for fractures or osteoporosis.  Skin self-exam. / Monthly.  Influenza vaccine. / Every year.  Tetanus, diphtheria, and acellular pertussis (Tdap/Td) vaccine.** / 1 dose of Td every 10 years.  Varicella vaccine.** / Consult your health care provider.  Zoster vaccine.** / 1 dose for adults aged 60 years or older.  Pneumococcal 13-valent conjugate (PCV13) vaccine.** / Consult your health care provider.  Pneumococcal  polysaccharide (PPSV23) vaccine.** / 1 dose for all adults aged 65 years and older.  Meningococcal vaccine.** / Consult your health care provider.  Hepatitis A vaccine.** / Consult your health care provider.  Hepatitis B vaccine.** / Consult your health care provider.  Haemophilus influenzae type b (Hib) vaccine.** / Consult your health care provider. ** Family history and personal history of risk and conditions may change your health care provider's recommendations. Document Released: 05/02/2001 Document Revised: 07/21/2013 Document Reviewed: 08/01/2010 ExitCare Patient Information 2015 [employer]. This information is not intended to replace advice given to you by your health care provider. Make sure you discuss any questions you have with your health care provider.  Preparing for Pregnancy Before trying to become pregnant, make an appointment with your health care provider (preconception care). The goal is to help you have a healthy, safe pregnancy. At your first appointment, your health care provider will:   Do a complete physical exam, including a Pap test.  Take a complete   medical history.  Give you advice and help you resolve any problems. PRECONCEPTION CHECKLIST Here is a list of the basics to cover with your health care provider at your preconception visit:  Medical history.  Tell your health care provider about any diseases you have had. Many diseases can affect your pregnancy.  Include your partner's medical history and family history.  Make sure you have been tested for sexually transmitted infections (STIs). These can affect your pregnancy. In some cases, they can be passed to your baby. Tell your health care provider about any history of STIs.  Make sure your health care provider knows about any previous problems you have had with conception or pregnancy.  Tell your health care provider about any medicine you take. This includes herbal supplements and  over-the-counter medicines.  Make sure all your immunizations are up to date. You may need to make additional appointments.  Ask your health care provider if you need any vaccinations or if there are any you should avoid.  Diet.  It is especially important to eat a healthy, balanced diet with the right nutrients when you are pregnant.  Ask your health care provider to help you get to a healthy weight before pregnancy.  If you are overweight, you are at higher risk for certain complications. These include high blood pressure, diabetes, and preterm birth.  If you are underweight, you are more likely to have a low-birth-weight baby.  Lifestyle.  Tell your health care provider about lifestyle factors such as alcohol use, drug use, or smoking.  Describe any harmful substances you may be exposed to at work or home. These can include chemicals, pesticides, and radiation.  Mental health.  Let your health care provider know if you have been feeling depressed or anxious.  Let your health care provider know if you have a history of substance abuse.  Let your health care provider know if you do not feel safe at home. HOME INSTRUCTIONS TO PREPARE FOR PREGNANCY Follow your health care provider's advice and instructions.   Keep an accurate record of your menstrual periods. This makes it easier for your health care provider to determine your baby's due date.  Begin taking prenatal vitamins and folic acid supplements daily. Take them as directed by your health care provider.  Eat a balanced diet. Get help from a nutrition counselor if you have questions or need help.  Get regular exercise. Try to be active for at least 30 minutes a day most days of the week.  Quit smoking, if you smoke.  Do not drink alcohol.  Do not take illegal drugs.  Get medical problems, such as diabetes or high blood pressure, under control.  If you have diabetes, make sure you do the following:  Have good  blood sugar control. If you have type 1 diabetes, use multiple daily doses of insulin. Do not use split-dose or premixed insulin.  Have an eye exam by a qualified eye care professional trained in caring for people with diabetes.  Get evaluated by your health care provider for cardiovascular disease.  Get to a healthy weight. If you are overweight or obese, reduce your weight with the help of a qualified health professional such as a registered dietitian. Ask your health care provider what the right weight range is for you. HOW DO I KNOW I AM PREGNANT? You may be pregnant if you have been sexually active and you miss your period. Symptoms of early pregnancy include:   Mild cramping.  Very   light vaginal bleeding (spotting).  Feeling unusually tired.  Morning sickness. If you have any of these symptoms, take a home pregnancy test. These tests look for a hormone called human chorionic gonadotropin (hCG) in your urine. Your body begins to make this hormone during early pregnancy. These tests are very accurate. Wait until at least the first day you miss your period to take one. If you get a positive result, call your health care provider to make appointments for prenatal care. WHAT SHOULD I DO IF I BECOME PREGNANT?  Make an appointment with your health care provider by week 12 of your pregnancy at the latest.  Do not smoke. Smoking can be harmful to your baby.  Do not drink alcoholic beverages. Alcohol is related to a number of birth defects.  Avoid toxic odors and chemicals.  You may continue to have sexual intercourse if it does not cause pain or other problems, such as vaginal bleeding. Document Released: 02/17/2008 Document Revised: 07/21/2013 Document Reviewed: 02/10/2013 ExitCare Patient Information 2015 [employer]. This information is not intended to replace advice given to you by your health care provider. Make sure you discuss any questions you have with your health care  provider.  

## 2014-01-01 LAB — CYTOLOGY - PAP

## 2014-01-02 ENCOUNTER — Other Ambulatory Visit: Payer: Commercial Managed Care - PPO

## 2014-01-02 LAB — COMPREHENSIVE METABOLIC PANEL
ALT: 12 U/L (ref 0–35)
AST: 15 U/L (ref 0–37)
Albumin: 4.6 g/dL (ref 3.5–5.2)
Alkaline Phosphatase: 52 U/L (ref 39–117)
BUN: 14 mg/dL (ref 6–23)
CALCIUM: 9.5 mg/dL (ref 8.4–10.5)
CHLORIDE: 101 meq/L (ref 96–112)
CO2: 26 mEq/L (ref 19–32)
Creat: 0.74 mg/dL (ref 0.50–1.10)
Glucose, Bld: 85 mg/dL (ref 70–99)
POTASSIUM: 4.3 meq/L (ref 3.5–5.3)
Sodium: 135 mEq/L (ref 135–145)
Total Bilirubin: 0.9 mg/dL (ref 0.2–1.2)
Total Protein: 6.8 g/dL (ref 6.0–8.3)

## 2014-01-02 LAB — LIPID PANEL
Cholesterol: 141 mg/dL (ref 0–200)
HDL: 45 mg/dL (ref >39)
LDL Cholesterol: 75 mg/dL (ref 0–99)
Total CHOL/HDL Ratio: 3.1 Ratio
Triglycerides: 107 mg/dL (ref <150)
VLDL: 21 mg/dL (ref 0–40)

## 2014-01-02 LAB — CBC
HEMATOCRIT: 40.3 % (ref 36.0–46.0)
Hemoglobin: 14 g/dL (ref 12.0–15.0)
MCH: 29.6 pg (ref 26.0–34.0)
MCHC: 34.7 g/dL (ref 30.0–36.0)
MCV: 85.2 fL (ref 78.0–100.0)
Platelets: 291 10*3/uL (ref 150–400)
RBC: 4.73 MIL/uL (ref 3.87–5.11)
RDW: 12.5 % (ref 11.5–15.5)
WBC: 7 10*3/uL (ref 4.0–10.5)

## 2014-01-03 LAB — TSH: TSH: 1.295 u[IU]/mL (ref 0.350–4.500)

## 2014-01-19 ENCOUNTER — Telehealth: Payer: Self-pay | Admitting: *Deleted

## 2014-01-19 ENCOUNTER — Other Ambulatory Visit: Payer: Commercial Managed Care - PPO | Admitting: *Deleted

## 2014-01-19 ENCOUNTER — Encounter: Payer: Self-pay | Admitting: Family Medicine

## 2014-01-19 DIAGNOSIS — M545 Low back pain, unspecified: Secondary | ICD-10-CM

## 2014-01-19 DIAGNOSIS — N390 Urinary tract infection, site not specified: Secondary | ICD-10-CM

## 2014-01-19 MED ORDER — PHENAZOPYRIDINE HCL 200 MG PO TABS: 200.0000 mg | ORAL_TABLET | Freq: Three times a day (TID) | ORAL | Status: DC | PRN

## 2014-01-19 MED ORDER — CIPROFLOXACIN HCL 500 MG PO TABS: 500.0000 mg | ORAL_TABLET | Freq: Two times a day (BID) | ORAL | Status: DC

## 2014-01-19 NOTE — Addendum Note (Signed)
Addended by: Barbara Cower on: 01/19/2014 08:28 AM   Modules accepted: Orders

## 2014-01-19 NOTE — Progress Notes (Signed)
Pt walked in the office and wanted to be checked for a UTI.  Patient is having back pain.  Patient is taking Azo currently.  I will send patients urine off for culture.  I will send in Cipro to patients pharmacy for her to start taking.

## 2014-01-19 NOTE — Addendum Note (Signed)
Addended by: Tandy Gaw C on: 01/19/2014 08:13 AM   Modules accepted: Orders

## 2014-01-19 NOTE — Telephone Encounter (Signed)
Pt walked in the office and wanted to be checked for a UTI.  Pt is currently taking Azo.  I will send her urine off for culture.  I will send in Cipro to her pharmacy.

## 2014-01-20 LAB — URINE CULTURE

## 2014-03-21 DIAGNOSIS — N200 Calculus of kidney: Secondary | ICD-10-CM | POA: Insufficient documentation

## 2014-03-24 ENCOUNTER — Encounter: Payer: Self-pay | Admitting: Family Medicine

## 2014-03-24 ENCOUNTER — Ambulatory Visit (INDEPENDENT_AMBULATORY_CARE_PROVIDER_SITE_OTHER): Payer: Commercial Managed Care - PPO | Admitting: Family Medicine

## 2014-03-24 VITALS — BP 120/75 | HR 84 | Wt 213.4 lb

## 2014-03-24 DIAGNOSIS — O021 Missed abortion: Secondary | ICD-10-CM

## 2014-03-24 NOTE — Progress Notes (Signed)
   Subjective:    Patient ID: [name] is a 34 y.o. female presenting with Initial Prenatal Visit  on 03/24/2014  HPI: Here for New OB Initial u/s shows IUGS with Fetal Pole measuring 8wk 5 days with absent FHR.  Review of Systems  Denies vaginal bleeding    Objective:    BP 120/75 mmHg  Pulse 84  Wt 213 lb 6.4 oz (96.798 kg)  LMP 01/18/2014 (Exact Date) Physical Exam Gen: WD/WN female, NAD Abdomen: soft, NT GU: 10 wk size uterus, non-tender     Assessment & Plan:   Problem List Items Addressed This Visit      Unprioritized   Missed abortion - Primary    Offered expectant management, confirmatory U/s with [facility], repeat u/s here in a few days, misoprostol, and D & C.  Pt. Is understandable upset.  She will call back with decision, once she has discussed with her husband.

## 2014-03-24 NOTE — Patient Instructions (Signed)
Miscarriage A miscarriage is the sudden loss of an unborn baby (fetus) before the 20th week of pregnancy. Most miscarriages happen in the first 3 months of pregnancy. Sometimes, it happens before a woman even knows she is pregnant. A miscarriage is also called a "spontaneous miscarriage" or "early pregnancy loss." Having a miscarriage can be an emotional experience. Talk with your caregiver about any questions you may have about miscarrying, the grieving process, and your future pregnancy plans. CAUSES   Problems with the fetal chromosomes that make it impossible for the baby to develop normally. Problems with the baby's genes or chromosomes are most often the result of errors that occur, by chance, as the embryo divides and grows. The problems are not inherited from the parents.  Infection of the cervix or uterus.   Hormone problems.   Problems with the cervix, such as having an incompetent cervix. This is when the tissue in the cervix is not strong enough to hold the pregnancy.   Problems with the uterus, such as an abnormally shaped uterus, uterine fibroids, or congenital abnormalities.   Certain medical conditions.   Smoking, drinking alcohol, or taking illegal drugs.   Trauma.  Often, the cause of a miscarriage is unknown.  SYMPTOMS   Vaginal bleeding or spotting, with or without cramps or pain.  Pain or cramping in the abdomen or lower back.  Passing fluid, tissue, or blood clots from the vagina. DIAGNOSIS  Your caregiver will perform a physical exam. You may also have an ultrasound to confirm the miscarriage. Blood or urine tests may also be ordered. TREATMENT   Sometimes, treatment is not necessary if you naturally pass all the fetal tissue that was in the uterus. If some of the fetus or placenta remains in the body (incomplete miscarriage), tissue left behind may become infected and must be removed. Usually, a dilation and curettage (D and C) procedure is performed.  During a D and C procedure, the cervix is widened (dilated) and any remaining fetal or placental tissue is gently removed from the uterus.  Antibiotic medicines are prescribed if there is an infection. Other medicines may be given to reduce the size of the uterus (contract) if there is a lot of bleeding.  If you have Rh negative blood and your baby was Rh positive, you will need a Rh immunoglobulin shot. This shot will protect any future baby from having Rh blood problems in future pregnancies. HOME CARE INSTRUCTIONS   Your caregiver may order bed rest or may allow you to continue light activity. Resume activity as directed by your caregiver.  Have someone help with home and family responsibilities during this time.   Keep track of the number of sanitary pads you use each day and how soaked (saturated) they are. Write down this information.   Do not use tampons. Do not douche or have sexual intercourse until approved by your caregiver.   Only take over-the-counter or prescription medicines for pain or discomfort as directed by your caregiver.   Do not take aspirin. Aspirin can cause bleeding.   Keep all follow-up appointments with your caregiver.   If you or your partner have problems with grieving, talk to your caregiver or seek counseling to help cope with the pregnancy loss. Allow enough time to grieve before trying to get pregnant again.  SEEK IMMEDIATE MEDICAL CARE IF:   You have severe cramps or pain in your back or abdomen.  You have a fever.  You pass large blood clots (walnut-sized   or larger) ortissue from your vagina. Save any tissue for your caregiver to inspect.   Your bleeding increases.   You have a thick, bad-smelling vaginal discharge.  You become lightheaded, weak, or you faint.   You have chills.  MAKE SURE YOU:  Understand these instructions.  Will watch your condition.  Will get help right away if you are not doing well or get  worse. Document Released: 08/30/2000 Document Revised: 07/01/2012 Document Reviewed: 04/25/2011 ExitCare Patient Information 2015 [employer]. This information is not intended to replace advice given to you by your health care provider. Make sure you discuss any questions you have with your health care provider.  

## 2014-03-24 NOTE — Assessment & Plan Note (Signed)
Offered expectant management, confirmatory U/s with [facility], repeat u/s here in a few days, misoprostol, and D & C.  Pt. Is understandable upset.  She will call back with decision, once she has discussed with her husband.

## 2014-03-26 ENCOUNTER — Ambulatory Visit (INDEPENDENT_AMBULATORY_CARE_PROVIDER_SITE_OTHER): Payer: Commercial Managed Care - PPO | Admitting: Obstetrics & Gynecology

## 2014-03-26 DIAGNOSIS — O021 Missed abortion: Secondary | ICD-10-CM

## 2014-03-26 MED ORDER — MISOPROSTOL 200 MCG PO TABS: ORAL_TABLET | ORAL | Status: DC

## 2014-03-26 MED ORDER — IBUPROFEN 800 MG PO TABS: 800.0000 mg | ORAL_TABLET | Freq: Three times a day (TID) | ORAL | Status: DC | PRN

## 2014-03-26 MED ORDER — OXYCODONE-ACETAMINOPHEN 5-325 MG PO TABS: 1.0000 | ORAL_TABLET | ORAL | Status: DC | PRN

## 2014-03-26 NOTE — Progress Notes (Signed)
34 yo MW G5P2A3 here today for confirmatory u/s regarding early fetal demise. She has been considering her options. She has had 2 d&cs in the past for miscarriages. She would like to try cytotec. I have prescribed cytotec 800 mcg, IBU, and percocet.

## 2014-03-31 ENCOUNTER — Telehealth: Payer: Self-pay | Admitting: *Deleted

## 2014-03-31 ENCOUNTER — Other Ambulatory Visit: Payer: Self-pay | Admitting: Family Medicine

## 2014-03-31 DIAGNOSIS — O021 Missed abortion: Secondary | ICD-10-CM

## 2014-03-31 MED ORDER — OXYCODONE-ACETAMINOPHEN 5-325 MG PO TABS: 1.0000 | ORAL_TABLET | ORAL | Status: DC | PRN

## 2014-03-31 NOTE — Telephone Encounter (Signed)
Patient is calling because she is still cramping significantly and would like to know if she can have a refill of the pain medication she was given.  She is also still bleeding with clots that is medium to heavy in flow.

## 2014-04-03 ENCOUNTER — Other Ambulatory Visit (INDEPENDENT_AMBULATORY_CARE_PROVIDER_SITE_OTHER): Payer: Commercial Managed Care - PPO | Admitting: *Deleted

## 2014-04-03 DIAGNOSIS — N390 Urinary tract infection, site not specified: Secondary | ICD-10-CM

## 2014-04-03 LAB — POCT URINALYSIS DIPSTICK
Bilirubin, UA: NEGATIVE
GLUCOSE UA: NEGATIVE
KETONES UA: NEGATIVE
Nitrite, UA: NEGATIVE
PH UA: 6.5
PROTEIN UA: NEGATIVE
Urobilinogen, UA: NEGATIVE

## 2014-04-03 MED ORDER — CIPROFLOXACIN HCL 500 MG PO TABS: 500.0000 mg | ORAL_TABLET | Freq: Two times a day (BID) | ORAL | Status: DC

## 2014-04-03 NOTE — Progress Notes (Signed)
Patient is having increased urination and significant increase in back discomfort.  These are her typical symptoms when she is developing a urinary tract infection.

## 2014-04-05 LAB — URINE CULTURE: Colony Count: 40000

## 2014-05-16 ENCOUNTER — Emergency Department: Payer: Self-pay | Admitting: Internal Medicine

## 2014-11-09 ENCOUNTER — Encounter: Payer: Self-pay | Admitting: *Deleted

## 2014-11-09 ENCOUNTER — Other Ambulatory Visit (INDEPENDENT_AMBULATORY_CARE_PROVIDER_SITE_OTHER): Payer: Commercial Managed Care - PPO | Admitting: *Deleted

## 2014-11-09 VITALS — BP 107/71 | HR 84 | Temp 98.7°F | Resp 16

## 2014-11-09 DIAGNOSIS — N390 Urinary tract infection, site not specified: Secondary | ICD-10-CM | POA: Diagnosis not present

## 2014-11-09 NOTE — Progress Notes (Signed)
Subjective:    [name] is a 33 y.o. female who complains of pain lower back, confusion, dizziness, body aches, muscle pain and fever for 3 days. Patient does have a history of recurrent UTI and nephrolithiasis and thinks she may have have UTI.    Laboratory:  Urine dipstick shows Large Blood but all other is normal. Will send for culture. Maintain adequate hydration Follow up if symptoms not improving, and prn. Will put in referral for urology per pt request for Hx of nephrolithiasis

## 2014-11-11 ENCOUNTER — Telehealth: Payer: Self-pay | Admitting: *Deleted

## 2014-11-11 LAB — CULTURE, URINE COMPREHENSIVE

## 2014-11-11 NOTE — Telephone Encounter (Signed)
Spoke to pt adv - Keflex 500 tid x 10days called to pharmacy per Nada Maclachlan, PA. Pt has appt with Urologist 12/24/14. Pt expressed understanding.

## 2014-12-24 ENCOUNTER — Encounter: Payer: Self-pay | Admitting: *Deleted

## 2014-12-24 DIAGNOSIS — N3020 Other chronic cystitis without hematuria: Secondary | ICD-10-CM | POA: Insufficient documentation

## 2015-01-19 ENCOUNTER — Encounter: Payer: Self-pay | Admitting: Family Medicine

## 2015-01-19 ENCOUNTER — Ambulatory Visit (INDEPENDENT_AMBULATORY_CARE_PROVIDER_SITE_OTHER): Payer: Commercial Managed Care - PPO | Admitting: Family Medicine

## 2015-01-19 VITALS — BP 106/74 | HR 78 | Wt 194.0 lb

## 2015-01-19 DIAGNOSIS — O3680X1 Pregnancy with inconclusive fetal viability, fetus 1: Secondary | ICD-10-CM | POA: Diagnosis not present

## 2015-01-19 DIAGNOSIS — Z3490 Encounter for supervision of normal pregnancy, unspecified, unspecified trimester: Secondary | ICD-10-CM

## 2015-01-19 NOTE — Progress Notes (Signed)
Bedside ultrasound today only shows yolk sac, no fetal pole/no fetal heartbeat.  Patient will return in 2-3 weeks for a new OB visit and another ultrasound.

## 2015-01-19 NOTE — Progress Notes (Signed)
Patient early and will come back in a few weeks for a new OB visit.

## 2015-01-27 ENCOUNTER — Telehealth: Payer: Self-pay | Admitting: *Deleted

## 2015-01-27 DIAGNOSIS — O219 Vomiting of pregnancy, unspecified: Secondary | ICD-10-CM

## 2015-01-27 MED ORDER — PROMETHAZINE HCL 25 MG PO TABS: 25.0000 mg | ORAL_TABLET | Freq: Four times a day (QID) | ORAL | Status: DC | PRN

## 2015-01-27 NOTE — Telephone Encounter (Signed)
Pt currently pregnant, c/o increasing nausea symptoms over the past few days, will send rx for Phenergan to pharmacy.  Pt informed of medication use.  Will call back with any problems.

## 2015-01-29 ENCOUNTER — Telehealth: Payer: Self-pay | Admitting: *Deleted

## 2015-01-29 DIAGNOSIS — O219 Vomiting of pregnancy, unspecified: Secondary | ICD-10-CM

## 2015-01-29 MED ORDER — METOCLOPRAMIDE HCL 10 MG PO TABS: 10.0000 mg | ORAL_TABLET | Freq: Four times a day (QID) | ORAL | Status: DC | PRN

## 2015-01-29 NOTE — Telephone Encounter (Signed)
-----   Message from Olevia Bowens sent at 01/29/2015  8:55 AM EST ----- Regarding: Rx Request Contact: [phone] We called in a Rx for promethazine..not helping, wants to know if she could try something else

## 2015-01-29 NOTE — Telephone Encounter (Signed)
Per Dr. Shawnie Pons, call in Reglan for patient to add to her Phenergan.   I  Have sent in the Reglan and pt is aware.

## 2015-02-08 ENCOUNTER — Ambulatory Visit (INDEPENDENT_AMBULATORY_CARE_PROVIDER_SITE_OTHER): Payer: Commercial Managed Care - PPO | Admitting: Obstetrics & Gynecology

## 2015-02-08 VITALS — BP 112/80 | HR 80 | Wt 220.0 lb

## 2015-02-08 DIAGNOSIS — O09529 Supervision of elderly multigravida, unspecified trimester: Secondary | ICD-10-CM

## 2015-02-08 DIAGNOSIS — O09521 Supervision of elderly multigravida, first trimester: Secondary | ICD-10-CM

## 2015-02-08 DIAGNOSIS — E669 Obesity, unspecified: Secondary | ICD-10-CM

## 2015-02-08 DIAGNOSIS — O99210 Obesity complicating pregnancy, unspecified trimester: Secondary | ICD-10-CM

## 2015-02-08 DIAGNOSIS — Z23 Encounter for immunization: Secondary | ICD-10-CM | POA: Diagnosis not present

## 2015-02-08 DIAGNOSIS — Z3490 Encounter for supervision of normal pregnancy, unspecified, unspecified trimester: Secondary | ICD-10-CM | POA: Insufficient documentation

## 2015-02-08 NOTE — Progress Notes (Signed)
   Subjective:    [name] is a 34 yo MW [id number] 8.1 being seen today for her first obstetrical visit.  Her obstetrical history is significant for advanced maternal age and recurrent miscarriages, and maternal obesity. Patient does intend to breast feed. Pregnancy history fully reviewed.  Patient reports no complaints. Her nausea is better with the phenergan.  There were no vitals filed for this visit.  HISTORY: OB History  Gravida Para Term Preterm AB SAB TAB Ectopic Multiple Living  6 2 2  2 2    2     # Outcome Date GA Lbr Len/2nd Weight Sex Delivery Anes PTL Lv  6 Current           5 Term 11/20/08   9 lb 6 oz (4.252 kg) M Jarrett Ables Y  4 Term 04/2006   9 lb 6 oz (4.252 kg) M Vag-Spont  N Y  3 Gravida           2 SAB           1 SAB              Past Medical History  Diagnosis Date  . Miscarriage     X2   Past Surgical History  Procedure Laterality Date  . Dilation and curettage of uterus      X2  . Leep    . Wisdom tooth extraction     Family History  Problem Relation Age of Onset  . Parkinsonism Mother   . Hypertension Father     history of hypertension  . Diabetes Maternal Grandfather     diabetes 2     Exam    Uterus:     Pelvic Exam:    Perineum: No Hemorrhoids   Vulva: normal   Vagina:  normal mucosa   pH:    Cervix: anteverted   Adnexa: normal adnexa   Bony Pelvis: android  System: Breast:  normal appearance, no masses or tenderness   Skin: normal coloration and turgor, no rashes    Neurologic: oriented   Extremities: normal strength, tone, and muscle mass   HEENT PERRLA   Mouth/Teeth mucous membranes moist, pharynx normal without lesions   Neck supple   Cardiovascular: regular rate and rhythm   Respiratory:  appears well, vitals normal, no respiratory distress, acyanotic, normal RR, ear and throat exam is normal, neck free of mass or lymphadenopathy, chest clear, no wheezing, crepitations, rhonchi, normal symmetric air entry   Abdomen: soft, non-tender; bowel sounds normal; no masses,  no organomegaly   Urinary: urethral meatus normal      Assessment:    Pregnancy: [id number] Patient Active Problem List   Diagnosis Date Noted  . AMA (advanced maternal age) multigravida 35+ 02/08/2015  . Obesity in pregnancy, antepartum 02/08/2015  . History of recurrent UTIs 10/12/2011        Plan:     Initial labs drawn. Prenatal vitamins. Problem list reviewed and updated. Genetic Screening discussed: She opts for NIPS. This will be drawn at 12 weeks.  Ultrasound discussed; fetal survey: requested.  Follow up in 4 weeks. Flu vaccine today Early glucola at next visit.  Lister Brizzi C. 02/08/2015

## 2015-02-08 NOTE — Progress Notes (Signed)
Bedside ultrasound today measures [redacted]w[redacted]d fetus with heartbeat.  

## 2015-02-09 LAB — CULTURE, OB URINE
Colony Count: NO GROWTH
Organism ID, Bacteria: NO GROWTH

## 2015-02-10 ENCOUNTER — Encounter: Payer: Commercial Managed Care - PPO | Admitting: Obstetrics & Gynecology

## 2015-03-06 ENCOUNTER — Encounter: Payer: Self-pay | Admitting: *Deleted

## 2015-03-06 ENCOUNTER — Emergency Department
Admission: EM | Admit: 2015-03-06 | Discharge: 2015-03-06 | Disposition: A | Discharge status: Home / Self Care | Payer: Commercial Managed Care - PPO | Attending: Emergency Medicine | Admitting: Emergency Medicine

## 2015-03-06 DIAGNOSIS — R8299 Other abnormal findings in urine: Secondary | ICD-10-CM | POA: Diagnosis not present

## 2015-03-06 DIAGNOSIS — O09521 Supervision of elderly multigravida, first trimester: Secondary | ICD-10-CM | POA: Insufficient documentation

## 2015-03-06 DIAGNOSIS — O9989 Other specified diseases and conditions complicating pregnancy, childbirth and the puerperium: Secondary | ICD-10-CM | POA: Diagnosis present

## 2015-03-06 DIAGNOSIS — Z87891 Personal history of nicotine dependence: Secondary | ICD-10-CM | POA: Diagnosis not present

## 2015-03-06 DIAGNOSIS — Z3A12 12 weeks gestation of pregnancy: Secondary | ICD-10-CM | POA: Insufficient documentation

## 2015-03-06 DIAGNOSIS — Z79899 Other long term (current) drug therapy: Secondary | ICD-10-CM | POA: Insufficient documentation

## 2015-03-06 DIAGNOSIS — M549 Dorsalgia, unspecified: Secondary | ICD-10-CM

## 2015-03-06 DIAGNOSIS — M545 Low back pain: Secondary | ICD-10-CM | POA: Insufficient documentation

## 2015-03-06 DIAGNOSIS — O99891 Other specified diseases and conditions complicating pregnancy: Secondary | ICD-10-CM

## 2015-03-06 LAB — URINALYSIS COMPLETE WITH MICROSCOPIC (ARMC ONLY)
BACTERIA UA: NONE SEEN
Bilirubin Urine: NEGATIVE
GLUCOSE, UA: NEGATIVE mg/dL
Ketones, ur: NEGATIVE mg/dL
Leukocytes, UA: NEGATIVE
Nitrite: NEGATIVE
Protein, ur: NEGATIVE mg/dL
Specific Gravity, Urine: 1.006 (ref 1.005–1.030)
pH: 6 (ref 5.0–8.0)

## 2015-03-06 NOTE — ED Notes (Signed)
Back pain, cramping, thought may have a uti. g6 p2 m 3

## 2015-03-06 NOTE — ED Notes (Signed)
Pt states difficulty urinating, odorous urine, and back pain, states hx of frequent UTI, states she is 12 weeks pregnet

## 2015-03-06 NOTE — ED Provider Notes (Signed)
[facility] Emergency Department Provider Note ____________________________________________  Time seen: Approximately 7:08 PM  I have reviewed the triage vital signs and the nursing notes.   HISTORY  Chief Complaint Back Pain and Dysuria   HPI [name] is a 34 y.o. female presents to the emergency department for evaluation of right lower back pain. She is concerned that she may have a urinary tract infection. She has had some malodorous urine over the past couple of days. She is approximately [redacted] weeks pregnant. Gravida 6 para 2 a 3. She denies vaginal bleeding or cramping. She denies vaginal discharge. She is closely followed at the [facility]. She has had a confirmed ultrasound IUP.   Past Medical History  Diagnosis Date  . Miscarriage     X2    Patient Active Problem List   Diagnosis Date Noted  . AMA (advanced maternal age) multigravida 35+ 02/08/2015  . Obesity in pregnancy, antepartum 02/08/2015  . History of recurrent UTIs 10/12/2011    Past Surgical History  Procedure Laterality Date  . Dilation and curettage of uterus      X2  . Leep    . Wisdom tooth extraction      Current Outpatient Rx  Name  Route  Sig  Dispense  Refill  . metoCLOPramide (REGLAN) 10 MG tablet   Oral   Take 1 tablet (10 mg total) by mouth 4 (four) times daily as needed for nausea or vomiting.   30 tablet   3   . Prenatal Vit-Fe Fumarate-FA (MULTIVITAMIN-PRENATAL) 27-0.8 MG TABS tablet   Oral   Take 1 tablet by mouth daily at 12 noon.         . promethazine (PHENERGAN) 25 MG tablet   Oral   Take 1 tablet (25 mg total) by mouth every 6 (six) hours as needed for nausea or vomiting.   30 tablet   2     Allergies Review of patient's allergies indicates no known allergies.  Family History  Problem Relation Age of Onset  . Parkinsonism Mother   . Hypertension Father     history of hypertension  . Diabetes Maternal Grandfather     diabetes 2    Social History Social History  Substance Use Topics  . Smoking status: Former Games developer  . Smokeless tobacco: Never Used  . Alcohol Use: No     Comment: socially    Review of Systems Constitutional: No fever/chills Cardiovascular: Denies chest pain. Respiratory: Denies shortness of breath or cough. Gastrointestinal: Abdominal pain no., nausea no, vomitingno. Genitourinary: Dysuria no, vaginal discharge no.. Musculoskeletal: Negative for back pain. Skin: Negative for rash. Neurological: Negative for headaches, focal weakness or numbness.  10-point ROS otherwise negative.  ____________________________________________   PHYSICAL EXAM:  VITAL SIGNS: ED Triage Vitals  Enc Vitals Group     BP 03/06/15 1724 115/69 mmHg     Pulse Rate 03/06/15 1724 82     Resp 03/06/15 1724 18     Temp 03/06/15 1724 97.6 F (36.4 C)     Temp Source 03/06/15 1724 Oral     SpO2 03/06/15 1724 99 %     Weight 03/06/15 1724 220 lb (99.791 kg)     Height 03/06/15 1724 5\' 7"  (1.702 m)     Head Cir --      Peak Flow --      Pain Score 03/06/15 1732 3     Pain Loc --      Pain  Edu? --      Excl. in GC? --     Constitutional: Alert and oriented. Well appearing and in no acute distress. Eyes: Conjunctivae are normal. PERRL. EOMI. Head: Atraumatic. Nose: No congestion/rhinnorhea. Mouth/Throat: Mucous membranes are moist.  Oropharynx non-erythematous. Neck: No stridor. Cardiovascular: Good peripheral circulation. Respiratory: Normal respiratory effort.  No retractions. Gastrointestinal: Soft and nontender. No distention. No abdominal bruits. Genitourinary: Pelvic exam: Deferred Musculoskeletal: No extremity tenderness nor edema.  Neurologic:  Normal speech and language. No gross focal neurologic deficits are appreciated. Speech is normal. No gait instability. Skin:  Skin is warm, dry and intact. No rash noted. Psychiatric: Mood and affect are normal. Speech and behavior are  normal.  ____________________________________________   LABS (all labs ordered are listed, but only abnormal results are displayed)  Labs Reviewed  URINALYSIS COMPLETEWITH MICROSCOPIC ([facility] ONLY) - Abnormal; Notable for the following:    Color, Urine STRAW (*)    APPearance CLEAR (*)    Hgb urine dipstick 1+ (*)    Squamous Epithelial / LPF 0-5 (*)    All other components within normal limits   ____________________________________________  RADIOLOGY  Not indicated ____________________________________________   PROCEDURES  Procedure(s) performed: None  ____________________________________________   INITIAL IMPRESSION / ASSESSMENT AND PLAN / ED COURSE  Pertinent labs & imaging results that were available during my care of the patient were reviewed by me and considered in my medical decision making (see chart for details).  Patient was strongly advised to call and schedule a follow-up appointment with her gynecologist. There'll be no internal exams today. She has had no vaginal bleeding or discharge. She has had a confirmed IUP. She was advised to stay on strict pelvic rest and push fluids. He was advised to return to the emergency department for abdominal pain, abdominal cramping, vaginal bleeding, or vaginal discharge if she is unable to see her gynecologist. ____________________________________________   FINAL CLINICAL IMPRESSION(S) / ED DIAGNOSES  Final diagnoses:  Back pain in pregnancy       Chinita Pester, FNP 03/06/15 2006  Sharman Cheek, MD 03/06/15 2211

## 2015-03-09 ENCOUNTER — Encounter (INDEPENDENT_AMBULATORY_CARE_PROVIDER_SITE_OTHER): Payer: Self-pay

## 2015-03-09 ENCOUNTER — Ambulatory Visit (INDEPENDENT_AMBULATORY_CARE_PROVIDER_SITE_OTHER): Payer: Commercial Managed Care - PPO | Admitting: Obstetrics & Gynecology

## 2015-03-09 VITALS — BP 126/80 | HR 84 | Wt 222.0 lb

## 2015-03-09 DIAGNOSIS — O09522 Supervision of elderly multigravida, second trimester: Secondary | ICD-10-CM

## 2015-03-09 DIAGNOSIS — Z36 Encounter for antenatal screening of mother: Secondary | ICD-10-CM | POA: Diagnosis not present

## 2015-03-09 DIAGNOSIS — O09529 Supervision of elderly multigravida, unspecified trimester: Secondary | ICD-10-CM

## 2015-03-09 NOTE — Addendum Note (Signed)
Addended by: Gita Kudo on: 03/09/2015 10:04 AM   Modules accepted: Orders

## 2015-03-09 NOTE — Progress Notes (Signed)
Subjective:  [name] is a 34 y.o. [phone] at [redacted]w[redacted]d being seen today for ongoing prenatal care.  She is currently monitored for the following issues for this high-risk pregnancy and has History of recurrent UTIs; AMA (advanced maternal age) multigravida 35+; and Obesity in pregnancy, antepartum on her problem list.  Patient reports no complaints.  Contractions: Not present. Vag. Bleeding: None.  Movement: Absent. Denies leaking of fluid.   The following portions of the patient's history were reviewed and updated as appropriate: allergies, current medications, past family history, past medical history, past social history, past surgical history and problem list. Problem list updated.  Objective:   Filed Vitals:   03/09/15 0936  BP: 126/80  Pulse: 84  Weight: 222 lb (100.699 kg)    Fetal Status: Fetal Heart Rate (bpm): 150   Movement: Absent     General:  Alert, oriented and cooperative. Patient is in no acute distress.  Skin: Skin is warm and dry. No rash noted.   Cardiovascular: Normal heart rate noted  Respiratory: Normal respiratory effort, no problems with respiration noted  Abdomen: Soft, gravid, appropriate for gestational age. Pain/Pressure: Absent     Pelvic: Vag. Bleeding: None Vag D/C Character: Thin   Cervical exam deferred        Extremities: Normal range of motion.  Edema: None  Mental Status: Normal mood and affect. Normal behavior. Normal judgment and thought content.   Urinalysis: Urine Protein: Negative Urine Glucose: Negative  Assessment and Plan:  Pregnancy: [id number] at [redacted]w[redacted]d  1. AMA (advanced maternal age) multigravida 35+, unspecified trimester -early glucola today - NIPS today  Preterm labor symptoms and general obstetric precautions including but not limited to vaginal bleeding, contractions, leaking of fluid and fetal movement were reviewed in detail with the patient. Please refer to After Visit Summary for other counseling recommendations.  Return  in about 4 weeks (around 04/06/2015).   Allie Bossier, MD

## 2015-03-10 LAB — GLUCOSE TOLERANCE, 1 HOUR (50G) W/O FASTING: Glucose, 1 Hour GTT: 103 mg/dL (ref 70–140)

## 2015-03-17 ENCOUNTER — Encounter: Payer: Self-pay | Admitting: *Deleted

## 2015-04-06 ENCOUNTER — Ambulatory Visit (INDEPENDENT_AMBULATORY_CARE_PROVIDER_SITE_OTHER): Payer: Commercial Managed Care - PPO | Admitting: Obstetrics & Gynecology

## 2015-04-06 VITALS — BP 106/72 | HR 80 | Wt 226.0 lb

## 2015-04-06 DIAGNOSIS — Z36 Encounter for antenatal screening of mother: Secondary | ICD-10-CM

## 2015-04-06 DIAGNOSIS — Z3482 Encounter for supervision of other normal pregnancy, second trimester: Secondary | ICD-10-CM

## 2015-04-06 DIAGNOSIS — O09522 Supervision of elderly multigravida, second trimester: Secondary | ICD-10-CM

## 2015-04-06 NOTE — Progress Notes (Signed)
Subjective:  [name] is a 35 y.o. [phone] at [redacted]w[redacted]d being seen today for ongoing prenatal care.  She is currently monitored for the following issues for this high-risk pregnancy and has History of recurrent UTIs; AMA (advanced maternal age) multigravida 35+; and Obesity in pregnancy, antepartum on her problem list.  Patient reports no complaints.  Contractions: Not present. Vag. Bleeding: None.  Movement: Absent. Denies leaking of fluid.   The following portions of the patient's history were reviewed and updated as appropriate: allergies, current medications, past family history, past medical history, past social history, past surgical history and problem list. Problem list updated.  Objective:   Filed Vitals:   04/06/15 0904  BP: 106/72  Pulse: 80  Weight: 226 lb (102.513 kg)    Fetal Status: Fetal Heart Rate (bpm): 149   Movement: Absent     General:  Alert, oriented and cooperative. Patient is in no acute distress.  Skin: Skin is warm and dry. No rash noted.   Cardiovascular: Normal heart rate noted  Respiratory: Normal respiratory effort, no problems with respiration noted  Abdomen: Soft, gravid, appropriate for gestational age. Pain/Pressure: Absent     Pelvic: Vag. Bleeding: None Vag D/C Character: Thin   Cervical exam deferred        Extremities: Normal range of motion.  Edema: None  Mental Status: Normal mood and affect. Normal behavior. Normal judgment and thought content.   Urinalysis: Urine Protein: Negative Urine Glucose: Negative  Assessment and Plan:  Pregnancy: [id number] at [redacted]w[redacted]d  1. AMA (advanced maternal age) multigravida 35+, second trimester Normal Panorama  - Alpha fetoprotein, maternal - [place] MFM OB COMP + 14 WK; Future - Prenatal Profile Will follow up results and manage accordingly.  Preterm labor symptoms and general obstetric precautions including but not limited to vaginal bleeding, contractions, leaking of fluid and fetal movement were reviewed in  detail with the patient. Please refer to After Visit Summary for other counseling recommendations.  Return in about 4 weeks (around 05/04/2015) for OB Visit.   Tereso Newcomer, MD

## 2015-04-06 NOTE — Patient Instructions (Signed)
Return to clinic for any obstetric concerns or go to [facility] for evaluation  

## 2015-04-07 LAB — PRENATAL PROFILE (SOLSTAS)
Antibody Screen: NEGATIVE
BASOS PCT: 0 % (ref 0–1)
Basophils Absolute: 0 10*3/uL (ref 0.0–0.1)
EOS ABS: 0.2 10*3/uL (ref 0.0–0.7)
EOS PCT: 2 % (ref 0–5)
HEMATOCRIT: 34 % — AB (ref 36.0–46.0)
HIV: NONREACTIVE
Hemoglobin: 11.5 g/dL — ABNORMAL LOW (ref 12.0–15.0)
Hepatitis B Surface Ag: NEGATIVE
LYMPHS PCT: 18 % (ref 12–46)
Lymphs Abs: 1.7 10*3/uL (ref 0.7–4.0)
MCH: 29.2 pg (ref 26.0–34.0)
MCHC: 33.8 g/dL (ref 30.0–36.0)
MCV: 86.3 fL (ref 78.0–100.0)
MONO ABS: 0.3 10*3/uL (ref 0.1–1.0)
MPV: 9 fL (ref 8.6–12.4)
Monocytes Relative: 3 % (ref 3–12)
Neutro Abs: 7.4 10*3/uL (ref 1.7–7.7)
Neutrophils Relative %: 77 % (ref 43–77)
PLATELETS: 258 10*3/uL (ref 150–400)
RBC: 3.94 MIL/uL (ref 3.87–5.11)
RDW: 13.6 % (ref 11.5–15.5)
RH TYPE: POSITIVE
Rubella: 3.11 Index — ABNORMAL HIGH (ref <0.90)
WBC: 9.6 10*3/uL (ref 4.0–10.5)

## 2015-04-07 LAB — ALPHA FETOPROTEIN, MATERNAL
AFP: 31.7 ng/mL
CURR GEST AGE: 17.5 wks.days
MOM FOR AFP: 1.02
Open Spina bifida: NEGATIVE

## 2015-04-09 ENCOUNTER — Other Ambulatory Visit: Payer: Self-pay | Admitting: Obstetrics & Gynecology

## 2015-04-09 ENCOUNTER — Ambulatory Visit (HOSPITAL_COMMUNITY)
Admission: RE | Admit: 2015-04-09 | Discharge: 2015-04-09 | Disposition: A | Discharge status: Home / Self Care | Payer: Commercial Managed Care - PPO | Source: Ambulatory Visit | Attending: Obstetrics & Gynecology | Admitting: Obstetrics & Gynecology

## 2015-04-09 DIAGNOSIS — O99212 Obesity complicating pregnancy, second trimester: Secondary | ICD-10-CM | POA: Insufficient documentation

## 2015-04-09 DIAGNOSIS — Z3A18 18 weeks gestation of pregnancy: Secondary | ICD-10-CM | POA: Insufficient documentation

## 2015-04-09 DIAGNOSIS — O09522 Supervision of elderly multigravida, second trimester: Secondary | ICD-10-CM

## 2015-04-09 DIAGNOSIS — Z36 Encounter for antenatal screening of mother: Secondary | ICD-10-CM | POA: Insufficient documentation

## 2015-05-04 ENCOUNTER — Ambulatory Visit (INDEPENDENT_AMBULATORY_CARE_PROVIDER_SITE_OTHER): Payer: Commercial Managed Care - PPO | Admitting: Physician Assistant

## 2015-05-04 VITALS — BP 113/77 | HR 98 | Wt 232.0 lb

## 2015-05-04 DIAGNOSIS — O09522 Supervision of elderly multigravida, second trimester: Secondary | ICD-10-CM

## 2015-05-04 DIAGNOSIS — Z3482 Encounter for supervision of other normal pregnancy, second trimester: Secondary | ICD-10-CM

## 2015-05-04 DIAGNOSIS — O26839 Pregnancy related renal disease, unspecified trimester: Secondary | ICD-10-CM

## 2015-05-04 DIAGNOSIS — O26832 Pregnancy related renal disease, second trimester: Secondary | ICD-10-CM

## 2015-05-04 DIAGNOSIS — N200 Calculus of kidney: Secondary | ICD-10-CM

## 2015-05-04 MED ORDER — OXYCODONE-ACETAMINOPHEN 5-325 MG PO TABS: 1.0000 | ORAL_TABLET | Freq: Four times a day (QID) | ORAL | Status: DC | PRN

## 2015-05-04 NOTE — Patient Instructions (Signed)

## 2015-05-04 NOTE — Progress Notes (Signed)
Subjective:  [name] is a 35 y.o. [phone] at [redacted]w[redacted]d being seen today for ongoing prenatal care.  Patient reports backache. Has had kidney stones before.  This is same feeling.  Has blood in urine, extra dark.  Sees specialist in [place].   Contractions: Not present.  Vag. Bleeding: None. Movement: Present. Denies leaking of fluid.   The following portions of the patient's history were reviewed and updated as appropriate: allergies, current medications, past family history, past medical history, past social history, past surgical history and problem list.   Objective:   Filed Vitals:   05/04/15 1106  BP: 113/77  Pulse: 98  Weight: 232 lb (105.235 kg)    Fetal Status: Fetal Heart Rate (bpm): 159 Fundal Height: 23 cm Movement: Present     General:  Alert, oriented and cooperative. Patient is in no acute distress.  Skin: Skin is warm and dry. No rash noted.   Cardiovascular: Normal heart rate noted  Respiratory: Normal respiratory effort, no problems with respiration noted  Abdomen: Soft, gravid, appropriate for gestational age. Pain/Pressure: Absent     Pelvic: Vag. Bleeding: None Vag D/C Character: Thin   Cervical exam deferred        Extremities: Normal range of motion.  Edema: None  Mental Status: Normal mood and affect. Normal behavior. Normal judgment and thought content.   Urinalysis: Urine Protein: Negative Urine Glucose: Negative  Assessment and Plan:  Pregnancy: [id number] at [redacted]w[redacted]d  1. Supervision of elderly multigravida, second trimester PNV qd - Culture, OB Urine - [place] MFM OB FOLLOW UP; Future  2. Kidney stone complicating pregnancy, second trimester Percocet PRN Further pain medicine to come from urologist for this.  Pt to get appt to be seen there asap  Preterm labor symptoms and general obstetric precautions including but not limited to vaginal bleeding, contractions, leaking of fluid and fetal movement were reviewed in detail with the patient. Please refer to  After Visit Summary for other counseling recommendations.  Return in about 4 weeks (around 06/01/2015) for OBF.   Bertram Denver, PA-C

## 2015-05-06 LAB — CULTURE, OB URINE

## 2015-05-10 ENCOUNTER — Other Ambulatory Visit: Payer: Self-pay | Admitting: Physician Assistant

## 2015-05-10 ENCOUNTER — Ambulatory Visit (HOSPITAL_COMMUNITY)
Admission: RE | Admit: 2015-05-10 | Discharge: 2015-05-10 | Disposition: A | Discharge status: Home / Self Care | Payer: Commercial Managed Care - PPO | Source: Ambulatory Visit | Attending: Physician Assistant | Admitting: Physician Assistant

## 2015-05-10 DIAGNOSIS — O09522 Supervision of elderly multigravida, second trimester: Secondary | ICD-10-CM

## 2015-05-10 DIAGNOSIS — Z36 Encounter for antenatal screening of mother: Secondary | ICD-10-CM | POA: Diagnosis not present

## 2015-05-10 DIAGNOSIS — Z3A22 22 weeks gestation of pregnancy: Secondary | ICD-10-CM | POA: Diagnosis not present

## 2015-05-10 DIAGNOSIS — O99212 Obesity complicating pregnancy, second trimester: Secondary | ICD-10-CM | POA: Diagnosis not present

## 2015-05-11 ENCOUNTER — Telehealth: Payer: Self-pay | Admitting: *Deleted

## 2015-05-11 NOTE — Telephone Encounter (Signed)
Pt is currently [redacted] wks pregnant, son has the stomach flu, would like advice on what she should do for prevention measures.  Informed pt to use good handwashing, dont share items, and sanitize areas of the home.  If patient starts experiencing the same symptoms it is important to try to stay hydrated and avoid dehydration is possible.

## 2015-05-12 ENCOUNTER — Encounter: Payer: Self-pay | Admitting: Obstetrics & Gynecology

## 2015-05-18 ENCOUNTER — Encounter: Payer: Self-pay | Admitting: Obstetrics & Gynecology

## 2015-05-19 ENCOUNTER — Encounter: Payer: Self-pay | Admitting: Obstetrics & Gynecology

## 2015-06-01 ENCOUNTER — Ambulatory Visit (INDEPENDENT_AMBULATORY_CARE_PROVIDER_SITE_OTHER): Payer: Commercial Managed Care - PPO | Admitting: Family Medicine

## 2015-06-01 ENCOUNTER — Telehealth: Payer: Self-pay | Admitting: *Deleted

## 2015-06-01 VITALS — BP 109/72 | HR 80 | Wt 231.0 lb

## 2015-06-01 DIAGNOSIS — K219 Gastro-esophageal reflux disease without esophagitis: Secondary | ICD-10-CM

## 2015-06-01 DIAGNOSIS — Z3482 Encounter for supervision of other normal pregnancy, second trimester: Secondary | ICD-10-CM

## 2015-06-01 DIAGNOSIS — R12 Heartburn: Principal | ICD-10-CM

## 2015-06-01 DIAGNOSIS — O26892 Other specified pregnancy related conditions, second trimester: Secondary | ICD-10-CM

## 2015-06-01 DIAGNOSIS — O09522 Supervision of elderly multigravida, second trimester: Secondary | ICD-10-CM

## 2015-06-01 DIAGNOSIS — O09529 Supervision of elderly multigravida, unspecified trimester: Secondary | ICD-10-CM | POA: Insufficient documentation

## 2015-06-01 MED ORDER — OMEPRAZOLE MAGNESIUM 20 MG PO TBEC: 20.0000 mg | DELAYED_RELEASE_TABLET | Freq: Every day | ORAL | Status: DC

## 2015-06-01 MED ORDER — OMEPRAZOLE 20 MG PO CPDR: 20.0000 mg | DELAYED_RELEASE_CAPSULE | Freq: Every day | ORAL | Status: DC

## 2015-06-01 NOTE — Telephone Encounter (Signed)
Sent new rx to pharmacy w/o OTC for her to get medication written as a prescription.

## 2015-06-01 NOTE — Telephone Encounter (Signed)
-----   Message from Olevia Bowens sent at 06/01/2015 11:26 AM EDT ----- Regarding: Rx Request Called and stated Dr. Shawnie Pons told her to take Prilosec, she wants to know if she can get it as a RX so her INS would cover it vs an OTC

## 2015-06-01 NOTE — Progress Notes (Signed)
Subjective:  [name] is a 35 y.o. [phone] at [redacted]w[redacted]d being seen today for ongoing prenatal care.  She is currently monitored for the following issues for this low-risk pregnancy and has History of recurrent UTIs; Supervision of normal pregnancy, antepartum; Obesity in pregnancy, antepartum; Kidney stone complicating pregnancy; and AMA (advanced maternal age) multigravida 35+ on her problem list.  Patient reports no complaints.  Contractions: Irregular. Vag. Bleeding: None.  Movement: Present. Denies leaking of fluid.   The following portions of the patient's history were reviewed and updated as appropriate: allergies, current medications, past family history, past medical history, past social history, past surgical history and problem list. Problem list updated.  Objective:   Filed Vitals:   06/01/15 1019  BP: 109/72  Pulse: 80  Weight: 231 lb (104.781 kg)    Fetal Status: Fetal Heart Rate (bpm): 143 Fundal Height: 25 cm Movement: Present     General:  Alert, oriented and cooperative. Patient is in no acute distress.  Skin: Skin is warm and dry. No rash noted.   Cardiovascular: Normal heart rate noted  Respiratory: Normal respiratory effort, no problems with respiration noted  Abdomen: Soft, gravid, appropriate for gestational age. Pain/Pressure: Absent     Pelvic: Vag. Bleeding: None Vag D/C Character: Thin   Cervical exam deferred        Extremities: Normal range of motion.  Edema: None  Mental Status: Normal mood and affect. Normal behavior. Normal judgment and thought content.   Urinalysis: Urine Protein: Negative Urine Glucose: Negative  Assessment and Plan:  Pregnancy: [id number] at [redacted]w[redacted]d  1. AMA (advanced maternal age) multigravida 35+, second trimester Normal NIPS  2. Supervision of normal pregnancy, antepartum, second trimester Continue routine prenatal care. 28 wk labs and TDaP next visit  3. Gastroesophageal reflux disease without esophagitis Stop TUMS due to  calcium content and making kidney stones--begin Prilosec - omeprazole (PRILOSEC OTC) 20 MG tablet; Take 1 tablet (20 mg total) by mouth daily.  Dispense: 30 tablet; Refill: 3  Preterm labor symptoms and general obstetric precautions including but not limited to vaginal bleeding, contractions, leaking of fluid and fetal movement were reviewed in detail with the patient. Please refer to After Visit Summary for other counseling recommendations.  Return in 2 weeks (on 06/15/2015) for 28 wk labs.   Reva Bores, MD

## 2015-06-01 NOTE — Patient Instructions (Signed)
Second Trimester of Pregnancy The second trimester is from week 13 through week 28, months 4 through 6. The second trimester is often a time when you feel your best. Your body has also adjusted to being pregnant, and you begin to feel better physically. Usually, morning sickness has lessened or quit completely, you may have more energy, and you may have an increase in appetite. The second trimester is also a time when the fetus is growing rapidly. At the end of the sixth month, the fetus is about 9 inches long and weighs about 1 pounds. You will likely begin to feel the baby move (quickening) between 18 and 20 weeks of the pregnancy. BODY CHANGES Your body goes through many changes during pregnancy. The changes vary from woman to woman.   Your weight will continue to increase. You will notice your lower abdomen bulging out.  You may begin to get stretch marks on your hips, abdomen, and breasts.  You may develop headaches that can be relieved by medicines approved by your health care provider.  You may urinate more often because the fetus is pressing on your bladder.  You may develop or continue to have heartburn as a result of your pregnancy.  You may develop constipation because certain hormones are causing the muscles that push waste through your intestines to slow down.  You may develop hemorrhoids or swollen, bulging veins (varicose veins).  You may have back pain because of the weight gain and pregnancy hormones relaxing your joints between the bones in your pelvis and as a result of a shift in weight and the muscles that support your balance.  Your breasts will continue to grow and be tender.  Your gums may bleed and may be sensitive to brushing and flossing.  Dark spots or blotches (chloasma, mask of pregnancy) may develop on your face. This will likely fade after the baby is born.  A dark line from your belly button to the pubic area (linea nigra) may appear. This will likely  fade after the baby is born.  You may have changes in your hair. These can include thickening of your hair, rapid growth, and changes in texture. Some women also have hair loss during or after pregnancy, or hair that feels dry or thin. Your hair will most likely return to normal after your baby is born. WHAT TO EXPECT AT YOUR PRENATAL VISITS During a routine prenatal visit:  You will be weighed to make sure you and the fetus are growing normally.  Your blood pressure will be taken.  Your abdomen will be measured to track your baby's growth.  The fetal heartbeat will be listened to.  Any test results from the previous visit will be discussed. Your health care provider may ask you:  How you are feeling.  If you are feeling the baby move.  If you have had any abnormal symptoms, such as leaking fluid, bleeding, severe headaches, or abdominal cramping.  If you are using any tobacco products, including cigarettes, chewing tobacco, and electronic cigarettes.  If you have any questions. Other tests that may be performed during your second trimester include:  Blood tests that check for:  Low iron levels (anemia).  Gestational diabetes (between 24 and 28 weeks).  Rh antibodies.  Urine tests to check for infections, diabetes, or protein in the urine.  An ultrasound to confirm the proper growth and development of the baby.  An amniocentesis to check for possible genetic problems.  Fetal screens for spina bifida   and Down syndrome.  HIV (human immunodeficiency virus) testing. Routine prenatal testing includes screening for HIV, unless you choose not to have this test. HOME CARE INSTRUCTIONS   Avoid all smoking, herbs, alcohol, and unprescribed drugs. These chemicals affect the formation and growth of the baby.  Do not use any tobacco products, including cigarettes, chewing tobacco, and electronic cigarettes. If you need help quitting, ask your health care provider. You may receive  counseling support and other resources to help you quit.  Follow your health care provider's instructions regarding medicine use. There are medicines that are either safe or unsafe to take during pregnancy.  Exercise only as directed by your health care provider. Experiencing uterine cramps is a good sign to stop exercising.  Continue to eat regular, healthy meals.  Wear a good support bra for breast tenderness.  Do not use hot tubs, steam rooms, or saunas.  Wear your seat belt at all times when driving.  Avoid raw meat, uncooked cheese, cat litter boxes, and soil used by cats. These carry germs that can cause birth defects in the baby.  Take your prenatal vitamins.  Take 1500-2000 mg of calcium daily starting at the 20th week of pregnancy until you deliver your baby.  Try taking a stool softener (if your health care provider approves) if you develop constipation. Eat more high-fiber foods, such as fresh vegetables or fruit and whole grains. Drink plenty of fluids to keep your urine clear or pale yellow.  Take warm sitz baths to soothe any pain or discomfort caused by hemorrhoids. Use hemorrhoid cream if your health care provider approves.  If you develop varicose veins, wear support hose. Elevate your feet for 15 minutes, 3-4 times a day. Limit salt in your diet.  Avoid heavy lifting, wear low heel shoes, and practice good posture.  Rest with your legs elevated if you have leg cramps or low back pain.  Visit your dentist if you have not gone yet during your pregnancy. Use a soft toothbrush to brush your teeth and be gentle when you floss.  A sexual relationship may be continued unless your health care provider directs you otherwise.  Continue to go to all your prenatal visits as directed by your health care provider. SEEK MEDICAL CARE IF:   You have dizziness.  You have mild pelvic cramps, pelvic pressure, or nagging pain in the abdominal area.  You have persistent nausea,  vomiting, or diarrhea.  You have a bad smelling vaginal discharge.  You have pain with urination. SEEK IMMEDIATE MEDICAL CARE IF:   You have a fever.  You are leaking fluid from your vagina.  You have spotting or bleeding from your vagina.  You have severe abdominal cramping or pain.  You have rapid weight gain or loss.  You have shortness of breath with chest pain.  You notice sudden or extreme swelling of your face, hands, ankles, feet, or legs.  You have not felt your baby move in over an hour.  You have severe headaches that do not go away with medicine.  You have vision changes.   This information is not intended to replace advice given to you by your health care provider. Make sure you discuss any questions you have with your health care provider.   Document Released: 02/28/2001 Document Revised: 03/27/2014 Document Reviewed: 05/07/2012 Elsevier Interactive Patient Education 2016 [employer]   Breastfeeding Deciding to breastfeed is one of the best choices you can make for you and your baby. A change   in hormones during pregnancy causes your breast tissue to grow and increases the number and size of your milk ducts. These hormones also allow proteins, sugars, and fats from your blood supply to make breast milk in your milk-producing glands. Hormones prevent breast milk from being released before your baby is born as well as prompt milk flow after birth. Once breastfeeding has begun, thoughts of your baby, as well as his or her sucking or crying, can stimulate the release of milk from your milk-producing glands.  BENEFITS OF BREASTFEEDING For Your Baby  Your first milk (colostrum) helps your baby's digestive system function better.  There are antibodies in your milk that help your baby fight off infections.  Your baby has a lower incidence of asthma, allergies, and sudden infant death syndrome.  The nutrients in breast milk are better for your baby than infant  formulas and are designed uniquely for your baby's needs.  Breast milk improves your baby's brain development.  Your baby is less likely to develop other conditions, such as childhood obesity, asthma, or type 2 diabetes mellitus. For You  Breastfeeding helps to create a very special bond between you and your baby.  Breastfeeding is convenient. Breast milk is always available at the correct temperature and costs nothing.  Breastfeeding helps to burn calories and helps you lose the weight gained during pregnancy.  Breastfeeding makes your uterus contract to its prepregnancy size faster and slows bleeding (lochia) after you give birth.   Breastfeeding helps to lower your risk of developing type 2 diabetes mellitus, osteoporosis, and breast or ovarian cancer later in life. SIGNS THAT YOUR BABY IS HUNGRY Early Signs of Hunger  Increased alertness or activity.  Stretching.  Movement of the head from side to side.  Movement of the head and opening of the mouth when the corner of the mouth or cheek is stroked (rooting).  Increased sucking sounds, smacking lips, cooing, sighing, or squeaking.  Hand-to-mouth movements.  Increased sucking of fingers or hands. Late Signs of Hunger  Fussing.  Intermittent crying. Extreme Signs of Hunger Signs of extreme hunger will require calming and consoling before your baby will be able to breastfeed successfully. Do not wait for the following signs of extreme hunger to occur before you initiate breastfeeding:  Restlessness.  A loud, strong cry.  Screaming. BREASTFEEDING BASICS Breastfeeding Initiation  Find a comfortable place to sit or lie down, with your neck and back well supported.  Place a pillow or rolled up blanket under your baby to bring him or her to the level of your breast (if you are seated). Nursing pillows are specially designed to help support your arms and your baby while you breastfeed.  Make sure that your baby's  abdomen is facing your abdomen.  Gently massage your breast. With your fingertips, massage from your chest wall toward your nipple in a circular motion. This encourages milk flow. You may need to continue this action during the feeding if your milk flows slowly.  Support your breast with 4 fingers underneath and your thumb above your nipple. Make sure your fingers are well away from your nipple and your baby's mouth.  Stroke your baby's lips gently with your finger or nipple.  When your baby's mouth is open wide enough, quickly bring your baby to your breast, placing your entire nipple and as much of the colored area around your nipple (areola) as possible into your baby's mouth.  More areola should be visible above your baby's upper lip than   below the lower lip.  Your baby's tongue should be between his or her lower gum and your breast.  Ensure that your baby's mouth is correctly positioned around your nipple (latched). Your baby's lips should create a seal on your breast and be turned out (everted).  It is common for your baby to suck about 2-3 minutes in order to start the flow of breast milk. Latching Teaching your baby how to latch on to your breast properly is very important. An improper latch can cause nipple pain and decreased milk supply for you and poor weight gain in your baby. Also, if your baby is not latched onto your nipple properly, he or she may swallow some air during feeding. This can make your baby fussy. Burping your baby when you switch breasts during the feeding can help to get rid of the air. However, teaching your baby to latch on properly is still the best way to prevent fussiness from swallowing air while breastfeeding. Signs that your baby has successfully latched on to your nipple:  Silent tugging or silent sucking, without causing you pain.  Swallowing heard between every 3-4 sucks.  Muscle movement above and in front of his or her ears while sucking. Signs  that your baby has not successfully latched on to nipple:  Sucking sounds or smacking sounds from your baby while breastfeeding.  Nipple pain. If you think your baby has not latched on correctly, slip your finger into the corner of your baby's mouth to break the suction and place it between your baby's gums. Attempt breastfeeding initiation again. Signs of Successful Breastfeeding Signs from your baby:  A gradual decrease in the number of sucks or complete cessation of sucking.  Falling asleep.  Relaxation of his or her body.  Retention of a small amount of milk in his or her mouth.  Letting go of your breast by himself or herself. Signs from you:  Breasts that have increased in firmness, weight, and size 1-3 hours after feeding.  Breasts that are softer immediately after breastfeeding.  Increased milk volume, as well as a change in milk consistency and color by the fifth day of breastfeeding.  Nipples that are not sore, cracked, or bleeding. Signs That Your Baby is Getting Enough Milk  Wetting at least 3 diapers in a 24-hour period. The urine should be clear and pale yellow by age 5 days.  At least 3 stools in a 24-hour period by age 5 days. The stool should be soft and yellow.  At least 3 stools in a 24-hour period by age 7 days. The stool should be seedy and yellow.  No loss of weight greater than 10% of birth weight during the first 3 days of age.  Average weight gain of 4-7 ounces (113-198 g) per week after age 4 days.  Consistent daily weight gain by age 5 days, without weight loss after the age of 2 weeks. After a feeding, your baby may spit up a small amount. This is common. BREASTFEEDING FREQUENCY AND DURATION Frequent feeding will help you make more milk and can prevent sore nipples and breast engorgement. Breastfeed when you feel the need to reduce the fullness of your breasts or when your baby shows signs of hunger. This is called "breastfeeding on demand." Avoid  introducing a pacifier to your baby while you are working to establish breastfeeding (the first 4-6 weeks after your baby is born). After this time you may choose to use a pacifier. Research has shown that   pacifier use during the first year of a baby's life decreases the risk of sudden infant death syndrome (SIDS). Allow your baby to feed on each breast as long as he or she wants. Breastfeed until your baby is finished feeding. When your baby unlatches or falls asleep while feeding from the first breast, offer the second breast. Because newborns are often sleepy in the first few weeks of life, you may need to awaken your baby to get him or her to feed. Breastfeeding times will vary from baby to baby. However, the following rules can serve as a guide to help you ensure that your baby is properly fed:  Newborns (babies 4 weeks of age or younger) may breastfeed every 1-3 hours.  Newborns should not go longer than 3 hours during the day or 5 hours during the night without breastfeeding.  You should breastfeed your baby a minimum of 8 times in a 24-hour period until you begin to introduce solid foods to your baby at around 6 months of age. BREAST MILK PUMPING Pumping and storing breast milk allows you to ensure that your baby is exclusively fed your breast milk, even at times when you are unable to breastfeed. This is especially important if you are going back to work while you are still breastfeeding or when you are not able to be present during feedings. Your lactation consultant can give you guidelines on how long it is safe to store breast milk. A breast pump is a machine that allows you to pump milk from your breast into a sterile bottle. The pumped breast milk can then be stored in a refrigerator or freezer. Some breast pumps are operated by hand, while others use electricity. Ask your lactation consultant which type will work best for you. Breast pumps can be purchased, but some hospitals and  breastfeeding support groups lease breast pumps on a monthly basis. A lactation consultant can teach you how to hand express breast milk, if you prefer not to use a pump. CARING FOR YOUR BREASTS WHILE YOU BREASTFEED Nipples can become dry, cracked, and sore while breastfeeding. The following recommendations can help keep your breasts moisturized and healthy:  Avoid using soap on your nipples.  Wear a supportive bra. Although not required, special nursing bras and tank tops are designed to allow access to your breasts for breastfeeding without taking off your entire bra or top. Avoid wearing underwire-style bras or extremely tight bras.  Air dry your nipples for 3-4minutes after each feeding.  Use only cotton bra pads to absorb leaked breast milk. Leaking of breast milk between feedings is normal.  Use lanolin on your nipples after breastfeeding. Lanolin helps to maintain your skin's normal moisture barrier. If you use pure lanolin, you do not need to wash it off before feeding your baby again. Pure lanolin is not toxic to your baby. You may also hand express a few drops of breast milk and gently massage that milk into your nipples and allow the milk to air dry. In the first few weeks after giving birth, some women experience extremely full breasts (engorgement). Engorgement can make your breasts feel heavy, warm, and tender to the touch. Engorgement peaks within 3-5 days after you give birth. The following recommendations can help ease engorgement:  Completely empty your breasts while breastfeeding or pumping. You may want to start by applying warm, moist heat (in the shower or with warm water-soaked hand towels) just before feeding or pumping. This increases circulation and helps the milk   flow. If your baby does not completely empty your breasts while breastfeeding, pump any extra milk after he or she is finished.  Wear a snug bra (nursing or regular) or tank top for 1-2 days to signal your body  to slightly decrease milk production.  Apply ice packs to your breasts, unless this is too uncomfortable for you.  Make sure that your baby is latched on and positioned properly while breastfeeding. If engorgement persists after 48 hours of following these recommendations, contact your health care provider or a lactation consultant. OVERALL HEALTH CARE RECOMMENDATIONS WHILE BREASTFEEDING  Eat healthy foods. Alternate between meals and snacks, eating 3 of each per day. Because what you eat affects your breast milk, some of the foods may make your baby more irritable than usual. Avoid eating these foods if you are sure that they are negatively affecting your baby.  Drink milk, fruit juice, and water to satisfy your thirst (about 10 glasses a day).  Rest often, relax, and continue to take your prenatal vitamins to prevent fatigue, stress, and anemia.  Continue breast self-awareness checks.  Avoid chewing and smoking tobacco. Chemicals from cigarettes that pass into breast milk and exposure to secondhand smoke may harm your baby.  Avoid alcohol and drug use, including marijuana. Some medicines that may be harmful to your baby can pass through breast milk. It is important to ask your health care provider before taking any medicine, including all over-the-counter and prescription medicine as well as vitamin and herbal supplements. It is possible to become pregnant while breastfeeding. If birth control is desired, ask your health care provider about options that will be safe for your baby. SEEK MEDICAL CARE IF:  You feel like you want to stop breastfeeding or have become frustrated with breastfeeding.  You have painful breasts or nipples.  Your nipples are cracked or bleeding.  Your breasts are red, tender, or warm.  You have a swollen area on either breast.  You have a fever or chills.  You have nausea or vomiting.  You have drainage other than breast milk from your nipples.  Your  breasts do not become full before feedings by the fifth day after you give birth.  You feel sad and depressed.  Your baby is too sleepy to eat well.  Your baby is having trouble sleeping.   Your baby is wetting less than 3 diapers in a 24-hour period.  Your baby has less than 3 stools in a 24-hour period.  Your baby's skin or the white part of his or her eyes becomes yellow.   Your baby is not gaining weight by 5 days of age. SEEK IMMEDIATE MEDICAL CARE IF:  Your baby is overly tired (lethargic) and does not want to wake up and feed.  Your baby develops an unexplained fever.   This information is not intended to replace advice given to you by your health care provider. Make sure you discuss any questions you have with your health care provider.   Document Released: 03/06/2005 Document Revised: 11/25/2014 Document Reviewed: 08/28/2012 Elsevier Interactive Patient Education 2016 [employer]  

## 2015-06-16 ENCOUNTER — Ambulatory Visit (INDEPENDENT_AMBULATORY_CARE_PROVIDER_SITE_OTHER): Payer: Commercial Managed Care - PPO | Admitting: Obstetrics & Gynecology

## 2015-06-16 VITALS — BP 113/78 | HR 81 | Wt 232.0 lb

## 2015-06-16 DIAGNOSIS — Z23 Encounter for immunization: Secondary | ICD-10-CM

## 2015-06-16 DIAGNOSIS — Z36 Encounter for antenatal screening of mother: Secondary | ICD-10-CM | POA: Diagnosis not present

## 2015-06-16 DIAGNOSIS — Z3482 Encounter for supervision of other normal pregnancy, second trimester: Secondary | ICD-10-CM

## 2015-06-16 LAB — CBC
HCT: 34.4 % — ABNORMAL LOW (ref 36.0–46.0)
HEMOGLOBIN: 11.4 g/dL — AB (ref 12.0–15.0)
MCH: 28.8 pg (ref 26.0–34.0)
MCHC: 33.1 g/dL (ref 30.0–36.0)
MCV: 86.9 fL (ref 78.0–100.0)
MPV: 9.5 fL (ref 8.6–12.4)
PLATELETS: 245 10*3/uL (ref 150–400)
RBC: 3.96 MIL/uL (ref 3.87–5.11)
RDW: 13.8 % (ref 11.5–15.5)
WBC: 12 10*3/uL — AB (ref 4.0–10.5)

## 2015-06-16 NOTE — Patient Instructions (Signed)

## 2015-06-16 NOTE — Progress Notes (Signed)
Subjective:  [name] is a 35 y.o. [phone] at 109w6d being seen today for ongoing prenatal care.  She is currently monitored for the following issues for this low-risk pregnancy and has History of recurrent UTIs; Supervision of normal pregnancy, antepartum; Obesity in pregnancy, antepartum; Kidney stone complicating pregnancy; and AMA (advanced maternal age) multigravida 35+ on her problem list.  Patient reports no complaints.  Contractions: Irregular. Vag. Bleeding: None.  Movement: Present. Denies leaking of fluid.   The following portions of the patient's history were reviewed and updated as appropriate: allergies, current medications, past family history, past medical history, past social history, past surgical history and problem list. Problem list updated.  Objective:   Filed Vitals:   06/16/15 0901  BP: 113/78  Pulse: 81  Weight: 232 lb (105.235 kg)    Fetal Status: Fetal Heart Rate (bpm): 150   Movement: Present     General:  Alert, oriented and cooperative. Patient is in no acute distress.  Skin: Skin is warm and dry. No rash noted.   Cardiovascular: Normal heart rate noted  Respiratory: Normal respiratory effort, no problems with respiration noted  Abdomen: Soft, gravid, appropriate for gestational age. Pain/Pressure: Absent     Pelvic: Vag. Bleeding: None Vag D/C Character: Thin   Cervical exam deferred        Extremities: Normal range of motion.  Edema: Trace  Mental Status: Normal mood and affect. Normal behavior. Normal judgment and thought content.   Urinalysis:      Assessment and Plan:  Pregnancy: [id number] at [redacted]w[redacted]d  1. Supervision of normal pregnancy, antepartum, second trimester Doing well - Glucose Tolerance, 1 HR (50g) - RPR - HIV antibody - CBC - Tdap vaccine greater than or equal to 7yo IM  Preterm labor symptoms and general obstetric precautions including but not limited to vaginal bleeding, contractions, leaking of fluid and fetal movement were  reviewed in detail with the patient. Please refer to After Visit Summary for other counseling recommendations.  Return in about 2 weeks (around 06/30/2015).   Adam Phenix, MD

## 2015-06-17 LAB — HIV ANTIBODY (ROUTINE TESTING W REFLEX): HIV: NONREACTIVE

## 2015-06-17 LAB — GLUCOSE TOLERANCE, 1 HOUR (50G) W/O FASTING: Glucose, 1 Hr, gestational: 118 mg/dL (ref <140)

## 2015-06-19 LAB — RPR

## 2015-06-30 ENCOUNTER — Ambulatory Visit (INDEPENDENT_AMBULATORY_CARE_PROVIDER_SITE_OTHER): Payer: Commercial Managed Care - PPO | Admitting: Obstetrics & Gynecology

## 2015-06-30 VITALS — BP 111/74 | HR 85 | Wt 236.0 lb

## 2015-06-30 DIAGNOSIS — R399 Unspecified symptoms and signs involving the genitourinary system: Secondary | ICD-10-CM

## 2015-06-30 DIAGNOSIS — N200 Calculus of kidney: Secondary | ICD-10-CM

## 2015-06-30 DIAGNOSIS — Z3483 Encounter for supervision of other normal pregnancy, third trimester: Secondary | ICD-10-CM

## 2015-06-30 DIAGNOSIS — B952 Enterococcus as the cause of diseases classified elsewhere: Secondary | ICD-10-CM

## 2015-06-30 DIAGNOSIS — O26833 Pregnancy related renal disease, third trimester: Secondary | ICD-10-CM

## 2015-06-30 DIAGNOSIS — N390 Urinary tract infection, site not specified: Secondary | ICD-10-CM

## 2015-06-30 DIAGNOSIS — Z87440 Personal history of urinary (tract) infections: Secondary | ICD-10-CM

## 2015-06-30 LAB — POCT URINALYSIS DIPSTICK
BILIRUBIN UA: NEGATIVE
Blood, UA: NEGATIVE
Glucose, UA: NEGATIVE
KETONES UA: NEGATIVE
LEUKOCYTES UA: NEGATIVE
NITRITE UA: NEGATIVE
PH UA: 6.5
Protein, UA: NEGATIVE
Spec Grav, UA: 1.02
Urobilinogen, UA: 0.2

## 2015-06-30 MED ORDER — OXYCODONE-ACETAMINOPHEN 5-325 MG PO TABS: 1.0000 | ORAL_TABLET | Freq: Four times a day (QID) | ORAL | Status: DC | PRN

## 2015-06-30 NOTE — Addendum Note (Signed)
Addended by: Arne Cleveland on: 06/30/2015 10:02 AM   Modules accepted: Orders

## 2015-06-30 NOTE — Progress Notes (Signed)
Subjective:  [name] is a 35 y.o. [phone] at [redacted]w[redacted]d being seen today for ongoing prenatal care.  She is currently monitored for the following issues for this low-risk pregnancy and has History of recurrent UTIs; Supervision of normal pregnancy, antepartum; Obesity in pregnancy, antepartum; Kidney stone complicating pregnancy; and AMA (advanced maternal age) multigravida 35+ on her problem list.  Patient reports passing two kidney stones recently, has cloudy urine. Feels she may have a UTI.  Still has some pain but has decreased since passage of stones. No fevers, back tenderness, N/V or other symptoms. Desires Percocet as needed for pain, takes only as needed.   Contractions: Irregular. Vag. Bleeding: None.  Movement: Present. Denies leaking of fluid.   The following portions of the patient's history were reviewed and updated as appropriate: allergies, current medications, past family history, past medical history, past social history, past surgical history and problem list. Problem list updated.  Objective:   Filed Vitals:   06/30/15 0937  BP: 111/74  Pulse: 85  Weight: 236 lb (107.049 kg)    Fetal Status: Fetal Heart Rate (bpm): 136 Fundal Height: 30 cm Movement: Present     General:  Alert, oriented and cooperative. Patient is in no acute distress.  Skin: Skin is warm and dry. No rash noted.   Cardiovascular: Normal heart rate noted  Respiratory: Normal respiratory effort, no problems with respiration noted  Abdomen: Soft, gravid, appropriate for gestational age. Pain/Pressure: Present     Pelvic: Vag. Bleeding: None Vag D/C Character: Thin  Cervical exam deferred        Extremities: Normal range of motion.  Edema: Trace  Mental Status: Normal mood and affect. Normal behavior. Normal judgment and thought content.   Urinalysis: Urine Protein: Negative Urine Glucose: Negative No nitrites, no blood, no LE.  Assessment and Plan:  Pregnancy: [id number] at [redacted]w[redacted]d  1. Kidney stone  complicating pregnancy, third trimester Will continue to observe. Continue Flomax, Percocet prescribed as needed; warned about risk of dependence, neonatal withdrawal etc. - oxyCODONE-acetaminophen (PERCOCET/ROXICET) 5-325 MG tablet; Take 1-2 tablets by mouth every 6 (six) hours as needed.  Dispense: 30 tablet; Refill: 0  2. UTI symptoms and History of recurrent UTIs - Culture, OB Urine, will follow up results and manage accordingly.  3. Supervision of normal pregnancy, antepartum, third trimester Preterm labor symptoms and general obstetric precautions including but not limited to vaginal bleeding, contractions, leaking of fluid and fetal movement were reviewed in detail with the patient. Please refer to After Visit Summary for other counseling recommendations.  Return in about 2 weeks (around 07/14/2015) for OB Visit.   Tereso Newcomer, MD

## 2015-06-30 NOTE — Progress Notes (Signed)
Pt c/o kidney stones and dark/cloudy urine despite increasing water intake

## 2015-06-30 NOTE — Patient Instructions (Signed)
Return to clinic for any scheduled appointments or obstetric concerns, or go to [facility] for evaluation  

## 2015-07-03 LAB — CULTURE, OB URINE

## 2015-07-05 ENCOUNTER — Telehealth: Payer: Self-pay | Admitting: *Deleted

## 2015-07-05 MED ORDER — AMOXICILLIN 500 MG PO CAPS: 500.0000 mg | ORAL_CAPSULE | Freq: Three times a day (TID) | ORAL | Status: DC

## 2015-07-05 NOTE — Telephone Encounter (Signed)
-----   Message from Ugonna A Anyanwu, MD sent at 07/05/2015  8:17 AM EDT ----- Amoxicillin prescribed to treat bacteruria given history of recurrent UTIs and kidney stones.  Please call to inform patient of results and advise her to pick up prescription. Results were also released to MyChart and patient was given recommendations as indicated.   

## 2015-07-05 NOTE — Telephone Encounter (Signed)
Pt returned call, informed her of urine cx and the need for treatment, instructed pt on medication use, pt acknowledged instructions.

## 2015-07-05 NOTE — Telephone Encounter (Signed)
Called pt, no answer, left message for pt to call the office.  

## 2015-07-05 NOTE — Telephone Encounter (Signed)
-----   Message from Tereso Newcomer, MD sent at 07/05/2015  8:17 AM EDT ----- Amoxicillin prescribed to treat bacteruria given history of recurrent UTIs and kidney stones.  Please call to inform patient of results and advise her to pick up prescription. Results were also released to MyChart and patient was given recommendations as indicated.

## 2015-07-05 NOTE — Addendum Note (Signed)
Addended by: Jaynie Collins A on: 07/05/2015 08:15 AM   Modules accepted: Orders

## 2015-07-14 ENCOUNTER — Ambulatory Visit (INDEPENDENT_AMBULATORY_CARE_PROVIDER_SITE_OTHER): Payer: Commercial Managed Care - PPO | Admitting: Certified Nurse Midwife

## 2015-07-14 VITALS — BP 107/72 | HR 88 | Wt 241.0 lb

## 2015-07-14 DIAGNOSIS — Z3483 Encounter for supervision of other normal pregnancy, third trimester: Secondary | ICD-10-CM

## 2015-07-14 DIAGNOSIS — O09523 Supervision of elderly multigravida, third trimester: Secondary | ICD-10-CM

## 2015-07-14 NOTE — Progress Notes (Signed)
No ffn available in office Subjective:  [name] is a 35 y.o. [id number] at [redacted]w[redacted]d being seen today for ongoing prenatal care.  She is currently monitored for the following issues for this high-risk pregnancy and has History of recurrent UTIs; Supervision of normal pregnancy, antepartum; Obesity in pregnancy, antepartum; Kidney stone complicating pregnancy; and AMA (advanced maternal age) multigravida 35+ on her problem list.  Patient reports contractions since on and off for the past week.  Contractions: Irregular. Vag. Bleeding: None.  Movement: Present. Denies leaking of fluid.   The following portions of the patient's history were reviewed and updated as appropriate: allergies, current medications, past family history, past medical history, past social history, past surgical history and problem list. Problem list updated.  Objective:   Filed Vitals:   07/14/15 1403  BP: 107/72  Pulse: 88  Weight: 241 lb (109.317 kg)    Fetal Status: Fetal Heart Rate (bpm): 143   Movement: Present     General:  Alert, oriented and cooperative. Patient is in no acute distress.  Skin: Skin is warm and dry. No rash noted.   Cardiovascular: Normal heart rate noted  Respiratory: Normal respiratory effort, no problems with respiration noted  Abdomen: Soft, gravid, appropriate for gestational age. Pain/Pressure: Present     Pelvic: Vag. Bleeding: None Vag D/C Character: Thin   Cervical exam performed        Extremities: Normal range of motion.  Edema: Trace  Mental Status: Normal mood and affect. Normal behavior. Normal judgment and thought content.   Urinalysis: Urine Protein: Negative Urine Glucose: Negative  Assessment and Plan:  Pregnancy: [id number] at [redacted]w[redacted]d  1. Supervision of normal pregnancy, antepartum, third trimester   2. AMA (advanced maternal age) multigravida 35+, third trimester   Preterm labor symptoms and general obstetric precautions including but not limited to vaginal bleeding,  contractions, leaking of fluid and fetal movement were reviewed in detail with the patient. Please refer to After Visit Summary for other counseling recommendations.  Return in about 2 weeks (around 07/28/2015).   Rhea Pink, CNM

## 2015-07-14 NOTE — Patient Instructions (Signed)

## 2015-07-28 ENCOUNTER — Encounter: Payer: Self-pay | Admitting: Obstetrics & Gynecology

## 2015-07-28 ENCOUNTER — Ambulatory Visit (INDEPENDENT_AMBULATORY_CARE_PROVIDER_SITE_OTHER): Payer: Commercial Managed Care - PPO | Admitting: Obstetrics & Gynecology

## 2015-07-28 VITALS — BP 116/76 | HR 94 | Wt 244.0 lb

## 2015-07-28 DIAGNOSIS — O99891 Other specified diseases and conditions complicating pregnancy: Secondary | ICD-10-CM

## 2015-07-28 DIAGNOSIS — O99213 Obesity complicating pregnancy, third trimester: Secondary | ICD-10-CM

## 2015-07-28 DIAGNOSIS — O09523 Supervision of elderly multigravida, third trimester: Secondary | ICD-10-CM

## 2015-07-28 DIAGNOSIS — O26843 Uterine size-date discrepancy, third trimester: Secondary | ICD-10-CM

## 2015-07-28 DIAGNOSIS — O26833 Pregnancy related renal disease, third trimester: Secondary | ICD-10-CM

## 2015-07-28 DIAGNOSIS — Z3483 Encounter for supervision of other normal pregnancy, third trimester: Secondary | ICD-10-CM

## 2015-07-28 DIAGNOSIS — N200 Calculus of kidney: Secondary | ICD-10-CM

## 2015-07-28 DIAGNOSIS — E669 Obesity, unspecified: Secondary | ICD-10-CM

## 2015-07-28 NOTE — Patient Instructions (Signed)

## 2015-07-28 NOTE — Progress Notes (Signed)
Subjective:  [name] is a 35 y.o. [phone] at [redacted]w[redacted]d being seen today for ongoing prenatal care.  She is currently monitored for the following issues for this high-risk pregnancy and has History of recurrent UTIs; Supervision of normal pregnancy, antepartum; Obesity in pregnancy, antepartum; Kidney stone complicating pregnancy; and AMA (advanced maternal age) multigravida 35+ on her problem list.  Patient reports no complaints.  Contractions: Irregular. Vag. Bleeding: None.  Movement: Present. Denies leaking of fluid.   The following portions of the patient's history were reviewed and updated as appropriate: allergies, current medications, past family history, past medical history, past social history, past surgical history and problem list. Problem list updated.  Objective:   Filed Vitals:   07/28/15 0851  BP: 116/76  Pulse: 94  Weight: 244 lb (110.678 kg)    Fetal Status: Fetal Heart Rate (bpm): 127 Fundal Height: 39 cm Movement: Present     General:  Alert, oriented and cooperative. Patient is in no acute distress.  Skin: Skin is warm and dry. No rash noted.   Cardiovascular: Normal heart rate noted  Respiratory: Normal respiratory effort, no problems with respiration noted  Abdomen: Soft, gravid, appropriate for gestational age. Pain/Pressure: Present     Pelvic: Vag. Bleeding: None Vag D/C Character: Thin   Cervical exam deferred        Extremities: Normal range of motion.  Edema: Trace  Mental Status: Normal mood and affect. Normal behavior. Normal judgment and thought content.   Urinalysis: Urine Protein: Negative Urine Glucose: Negative  Assessment and Plan:  Pregnancy: [id number] at [redacted]w[redacted]d  1. Uterine size date discrepancy, antepartum condition, third trimester  - US OB Limited; Future H/o LGA babies   2. Supervision of normal pregnancy, antepartum, third trimester  3. Obesity in pregnancy, antepartum, third trimester  4. AMA (advanced maternal age) multigravida  35+, third trimester  Neg genetic screen  5. Kidney stone complicating pregnancy, third trimester   Preterm labor symptoms and general obstetric precautions including but not limited to vaginal bleeding, contractions, leaking of fluid and fetal movement were reviewed in detail with the patient. Please refer to After Visit Summary for other counseling recommendations.  Return in about 2 weeks (around 08/11/2015).   Willodean Rosenthal, MD

## 2015-08-02 ENCOUNTER — Encounter: Payer: Self-pay | Admitting: Obstetrics & Gynecology

## 2015-08-02 ENCOUNTER — Other Ambulatory Visit: Payer: Self-pay | Admitting: Obstetrics & Gynecology

## 2015-08-02 ENCOUNTER — Ambulatory Visit (HOSPITAL_COMMUNITY)
Admission: RE | Admit: 2015-08-02 | Discharge: 2015-08-02 | Disposition: A | Discharge status: Home / Self Care | Payer: Commercial Managed Care - PPO | Source: Ambulatory Visit | Attending: Obstetrics & Gynecology | Admitting: Obstetrics & Gynecology

## 2015-08-02 DIAGNOSIS — O3660X1 Maternal care for excessive fetal growth, unspecified trimester, fetus 1: Secondary | ICD-10-CM

## 2015-08-02 DIAGNOSIS — O26843 Uterine size-date discrepancy, third trimester: Secondary | ICD-10-CM

## 2015-08-02 DIAGNOSIS — O99213 Obesity complicating pregnancy, third trimester: Secondary | ICD-10-CM

## 2015-08-02 DIAGNOSIS — Z3A34 34 weeks gestation of pregnancy: Secondary | ICD-10-CM

## 2015-08-02 DIAGNOSIS — O09523 Supervision of elderly multigravida, third trimester: Secondary | ICD-10-CM

## 2015-08-03 DIAGNOSIS — O3660X0 Maternal care for excessive fetal growth, unspecified trimester, not applicable or unspecified: Secondary | ICD-10-CM | POA: Insufficient documentation

## 2015-08-03 NOTE — Telephone Encounter (Signed)
Dr. Jolayne Panther reviewed patient's chart and ultrasound.  Called patient back and notified her that ultrasound is normal, baby is on the bigger size, but no need to worry.  Told patient she has been screened twice for gestational diabetes, both times results were normal.  Assured patient she does not have gestational diabetes.  Patient felt better and will come in at her next scheduled appointment.

## 2015-08-10 ENCOUNTER — Ambulatory Visit (INDEPENDENT_AMBULATORY_CARE_PROVIDER_SITE_OTHER): Payer: Commercial Managed Care - PPO | Admitting: Obstetrics & Gynecology

## 2015-08-10 VITALS — BP 111/75 | HR 86 | Wt 248.0 lb

## 2015-08-10 DIAGNOSIS — Z3483 Encounter for supervision of other normal pregnancy, third trimester: Secondary | ICD-10-CM

## 2015-08-10 DIAGNOSIS — Z1151 Encounter for screening for human papillomavirus (HPV): Secondary | ICD-10-CM | POA: Diagnosis not present

## 2015-08-10 DIAGNOSIS — Z113 Encounter for screening for infections with a predominantly sexual mode of transmission: Secondary | ICD-10-CM

## 2015-08-10 DIAGNOSIS — O3660X1 Maternal care for excessive fetal growth, unspecified trimester, fetus 1: Secondary | ICD-10-CM

## 2015-08-10 LAB — OB RESULTS CONSOLE GBS: STREP GROUP B AG: NEGATIVE

## 2015-08-10 NOTE — Patient Instructions (Signed)
Return to clinic for any scheduled appointments or obstetric concerns, or go to [facility] for evaluation  

## 2015-08-10 NOTE — Progress Notes (Signed)
Subjective:  [name] is a 35 y.o. [phone] at [redacted]w[redacted]d being seen today for ongoing prenatal care.  She is currently monitored for the following issues for this low-risk pregnancy and has History of recurrent UTIs; Supervision of normal pregnancy, antepartum; Obesity in pregnancy, antepartum; Kidney stone complicating pregnancy; AMA (advanced maternal age) multigravida 35+; and Large for gestational age fetus affecting management of mother, antepartum on her problem list.  Patient reports occasional contractions.  Contractions: Irregular. Vag. Bleeding: None.  Movement: Present. Denies leaking of fluid.   The following portions of the patient's history were reviewed and updated as appropriate: allergies, current medications, past family history, past medical history, past social history, past surgical history and problem list. Problem list updated.  Objective:   Filed Vitals:   08/10/15 1315  BP: 111/75  Pulse: 86  Weight: 248 lb (112.492 kg)    Fetal Status: Fetal Heart Rate (bpm): 130 Fundal Height: 40 cm Movement: Present  Presentation: Vertex  General:  Alert, oriented and cooperative. Patient is in no acute distress.  Skin: Skin is warm and dry. No rash noted.   Cardiovascular: Normal heart rate noted  Respiratory: Normal respiratory effort, no problems with respiration noted  Abdomen: Soft, gravid, appropriate for gestational age. Pain/Pressure: Absent     Pelvic: Vag. Bleeding: None Vag D/C Character: Thin  Cervical exam performed Dilation: Closed Effacement (%): Thick Station: Ballotable  Pelvic cultures done.  Extremities: Normal range of motion.  Edema: Mild pitting, slight indentation  Mental Status: Normal mood and affect. Normal behavior. Normal judgment and thought content.   Urinalysis: Urine Protein: Trace Urine Glucose: Negative  08/02/2015 [redacted]w[redacted]d EFW 3253g (7-3) >90th%ile; AC >97%ile, AFI 18.3 cm, cephalic  Assessment and Plan:  Pregnancy: [id number] at [redacted]w[redacted]d  1.  Large for gestational age fetus affecting management of mother, antepartum, unspecified trimester, fetus 1 Continue to monitor. Repeat scan at 38-39 weeks ordered.  2. Supervision of normal pregnancy, antepartum, third trimester - GC/Chlamydia probe amp ([place])not at [facility] - Culture, beta strep (group b only) Preterm labor symptoms and general obstetric precautions including but not limited to vaginal bleeding, contractions, leaking of fluid and fetal movement were reviewed in detail with the patient. Please refer to After Visit Summary for other counseling recommendations.  Return in about 1 week (around 08/17/2015) for OB Visit.   Tereso Newcomer, MD

## 2015-08-11 ENCOUNTER — Encounter: Payer: Commercial Managed Care - PPO | Admitting: Obstetrics and Gynecology

## 2015-08-11 LAB — GC/CHLAMYDIA PROBE AMP (~~LOC~~) NOT AT ARMC
CHLAMYDIA, DNA PROBE: NEGATIVE
NEISSERIA GONORRHEA: NEGATIVE

## 2015-08-12 ENCOUNTER — Encounter: Payer: Commercial Managed Care - PPO | Admitting: Certified Nurse Midwife

## 2015-08-12 LAB — CULTURE, BETA STREP (GROUP B ONLY)

## 2015-08-17 ENCOUNTER — Ambulatory Visit (INDEPENDENT_AMBULATORY_CARE_PROVIDER_SITE_OTHER): Payer: Commercial Managed Care - PPO | Admitting: Family Medicine

## 2015-08-17 VITALS — BP 109/74 | HR 84 | Wt 252.0 lb

## 2015-08-17 DIAGNOSIS — O26833 Pregnancy related renal disease, third trimester: Secondary | ICD-10-CM

## 2015-08-17 DIAGNOSIS — Z3483 Encounter for supervision of other normal pregnancy, third trimester: Secondary | ICD-10-CM

## 2015-08-17 DIAGNOSIS — O3663X0 Maternal care for excessive fetal growth, third trimester, not applicable or unspecified: Secondary | ICD-10-CM

## 2015-08-17 DIAGNOSIS — N200 Calculus of kidney: Secondary | ICD-10-CM

## 2015-08-17 NOTE — Patient Instructions (Signed)
Third Trimester of Pregnancy The third trimester is from week 29 through week 42, months 7 through 9. The third trimester is a time when the fetus is growing rapidly. At the end of the ninth month, the fetus is about 20 inches in length and weighs 6-10 pounds.  BODY CHANGES Your body goes through many changes during pregnancy. The changes vary from woman to woman.   Your weight will continue to increase. You can expect to gain 25-35 pounds (11-16 kg) by the end of the pregnancy.  You may begin to get stretch marks on your hips, abdomen, and breasts.  You may urinate more often because the fetus is moving lower into your pelvis and pressing on your bladder.  You may develop or continue to have heartburn as a result of your pregnancy.  You may develop constipation because certain hormones are causing the muscles that push waste through your intestines to slow down.  You may develop hemorrhoids or swollen, bulging veins (varicose veins).  You may have pelvic pain because of the weight gain and pregnancy hormones relaxing your joints between the bones in your pelvis. Backaches may result from overexertion of the muscles supporting your posture.  You may have changes in your hair. These can include thickening of your hair, rapid growth, and changes in texture. Some women also have hair loss during or after pregnancy, or hair that feels dry or thin. Your hair will most likely return to normal after your baby is born.  Your breasts will continue to grow and be tender. A yellow discharge may leak from your breasts called colostrum.  Your belly button may stick out.  You may feel short of breath because of your expanding uterus.  You may notice the fetus "dropping," or moving lower in your abdomen.  You may have a bloody mucus discharge. This usually occurs a few days to a week before labor begins.  Your cervix becomes thin and soft (effaced) near your due date. WHAT TO EXPECT AT YOUR  PRENATAL EXAMS  You will have prenatal exams every 2 weeks until week 36. Then, you will have weekly prenatal exams. During a routine prenatal visit:  You will be weighed to make sure you and the fetus are growing normally.  Your blood pressure is taken.  Your abdomen will be measured to track your baby's growth.  The fetal heartbeat will be listened to.  Any test results from the previous visit will be discussed.  You may have a cervical check near your due date to see if you have effaced. At around 36 weeks, your caregiver will check your cervix. At the same time, your caregiver will also perform a test on the secretions of the vaginal tissue. This test is to determine if a type of bacteria, Group B streptococcus, is present. Your caregiver will explain this further. Your caregiver may ask you:  What your birth plan is.  How you are feeling.  If you are feeling the baby move.  If you have had any abnormal symptoms, such as leaking fluid, bleeding, severe headaches, or abdominal cramping.  If you are using any tobacco products, including cigarettes, chewing tobacco, and electronic cigarettes.  If you have any questions. Other tests or screenings that may be performed during your third trimester include:  Blood tests that check for low iron levels (anemia).  Fetal testing to check the health, activity level, and growth of the fetus. Testing is done if you have certain medical conditions or if   there are problems during the pregnancy.  HIV (human immunodeficiency virus) testing. If you are at high risk, you may be screened for HIV during your third trimester of pregnancy. FALSE LABOR You may feel small, irregular contractions that eventually go away. These are called Braxton Hicks contractions, or false labor. Contractions may last for hours, days, or even weeks before true labor sets in. If contractions come at regular intervals, intensify, or become painful, it is best to be seen  by your caregiver.  SIGNS OF LABOR   Menstrual-like cramps.  Contractions that are 5 minutes apart or less.  Contractions that start on the top of the uterus and spread down to the lower abdomen and back.  A sense of increased pelvic pressure or back pain.  A watery or bloody mucus discharge that comes from the vagina. If you have any of these signs before the 37th week of pregnancy, call your caregiver right away. You need to go to the hospital to get checked immediately. HOME CARE INSTRUCTIONS   Avoid all smoking, herbs, alcohol, and unprescribed drugs. These chemicals affect the formation and growth of the baby.  Do not use any tobacco products, including cigarettes, chewing tobacco, and electronic cigarettes. If you need help quitting, ask your health care provider. You may receive counseling support and other resources to help you quit.  Follow your caregiver's instructions regarding medicine use. There are medicines that are either safe or unsafe to take during pregnancy.  Exercise only as directed by your caregiver. Experiencing uterine cramps is a good sign to stop exercising.  Continue to eat regular, healthy meals.  Wear a good support bra for breast tenderness.  Do not use hot tubs, steam rooms, or saunas.  Wear your seat belt at all times when driving.  Avoid raw meat, uncooked cheese, cat litter boxes, and soil used by cats. These carry germs that can cause birth defects in the baby.  Take your prenatal vitamins.  Take 1500-2000 mg of calcium daily starting at the 20th week of pregnancy until you deliver your baby.  Try taking a stool softener (if your caregiver approves) if you develop constipation. Eat more high-fiber foods, such as fresh vegetables or fruit and whole grains. Drink plenty of fluids to keep your urine clear or pale yellow.  Take warm sitz baths to soothe any pain or discomfort caused by hemorrhoids. Use hemorrhoid cream if your caregiver  approves.  If you develop varicose veins, wear support hose. Elevate your feet for 15 minutes, 3-4 times a day. Limit salt in your diet.  Avoid heavy lifting, wear low heal shoes, and practice good posture.  Rest a lot with your legs elevated if you have leg cramps or low back pain.  Visit your dentist if you have not gone during your pregnancy. Use a soft toothbrush to brush your teeth and be gentle when you floss.  A sexual relationship may be continued unless your caregiver directs you otherwise.  Do not travel far distances unless it is absolutely necessary and only with the approval of your caregiver.  Take prenatal classes to understand, practice, and ask questions about the labor and delivery.  Make a trial run to the hospital.  Pack your hospital bag.  Prepare the baby's nursery.  Continue to go to all your prenatal visits as directed by your caregiver. SEEK MEDICAL CARE IF:  You are unsure if you are in labor or if your water has broken.  You have dizziness.  You have   mild pelvic cramps, pelvic pressure, or nagging pain in your abdominal area.  You have persistent nausea, vomiting, or diarrhea.  You have a bad smelling vaginal discharge.  You have pain with urination. SEEK IMMEDIATE MEDICAL CARE IF:   You have a fever.  You are leaking fluid from your vagina.  You have spotting or bleeding from your vagina.  You have severe abdominal cramping or pain.  You have rapid weight loss or gain.  You have shortness of breath with chest pain.  You notice sudden or extreme swelling of your face, hands, ankles, feet, or legs.  You have not felt your baby move in over an hour.  You have severe headaches that do not go away with medicine.  You have vision changes.   This information is not intended to replace advice given to you by your health care provider. Make sure you discuss any questions you have with your health care provider.   Document Released:  02/28/2001 Document Revised: 03/27/2014 Document Reviewed: 05/07/2012 Elsevier Interactive Patient Education 2016 [employer]  Breastfeeding Deciding to breastfeed is one of the best choices you can make for you and your baby. A change in hormones during pregnancy causes your breast tissue to grow and increases the number and size of your milk ducts. These hormones also allow proteins, sugars, and fats from your blood supply to make breast milk in your milk-producing glands. Hormones prevent breast milk from being released before your baby is born as well as prompt milk flow after birth. Once breastfeeding has begun, thoughts of your baby, as well as his or her sucking or crying, can stimulate the release of milk from your milk-producing glands.  BENEFITS OF BREASTFEEDING For Your Baby  Your first milk (colostrum) helps your baby's digestive system function better.  There are antibodies in your milk that help your baby fight off infections.  Your baby has a lower incidence of asthma, allergies, and sudden infant death syndrome.  The nutrients in breast milk are better for your baby than infant formulas and are designed uniquely for your baby's needs.  Breast milk improves your baby's brain development.  Your baby is less likely to develop other conditions, such as childhood obesity, asthma, or type 2 diabetes mellitus. For You  Breastfeeding helps to create a very special bond between you and your baby.  Breastfeeding is convenient. Breast milk is always available at the correct temperature and costs nothing.  Breastfeeding helps to burn calories and helps you lose the weight gained during pregnancy.  Breastfeeding makes your uterus contract to its prepregnancy size faster and slows bleeding (lochia) after you give birth.   Breastfeeding helps to lower your risk of developing type 2 diabetes mellitus, osteoporosis, and breast or ovarian cancer later in life. SIGNS THAT YOUR BABY IS  HUNGRY Early Signs of Hunger  Increased alertness or activity.  Stretching.  Movement of the head from side to side.  Movement of the head and opening of the mouth when the corner of the mouth or cheek is stroked (rooting).  Increased sucking sounds, smacking lips, cooing, sighing, or squeaking.  Hand-to-mouth movements.  Increased sucking of fingers or hands. Late Signs of Hunger  Fussing.  Intermittent crying. Extreme Signs of Hunger Signs of extreme hunger will require calming and consoling before your baby will be able to breastfeed successfully. Do not wait for the following signs of extreme hunger to occur before you initiate breastfeeding:  Restlessness.  A loud, strong cry.  Screaming.   BREASTFEEDING BASICS Breastfeeding Initiation  Find a comfortable place to sit or lie down, with your neck and back well supported.  Place a pillow or rolled up blanket under your baby to bring him or her to the level of your breast (if you are seated). Nursing pillows are specially designed to help support your arms and your baby while you breastfeed.  Make sure that your baby's abdomen is facing your abdomen.  Gently massage your breast. With your fingertips, massage from your chest wall toward your nipple in a circular motion. This encourages milk flow. You may need to continue this action during the feeding if your milk flows slowly.  Support your breast with 4 fingers underneath and your thumb above your nipple. Make sure your fingers are well away from your nipple and your baby's mouth.  Stroke your baby's lips gently with your finger or nipple.  When your baby's mouth is open wide enough, quickly bring your baby to your breast, placing your entire nipple and as much of the colored area around your nipple (areola) as possible into your baby's mouth.  More areola should be visible above your baby's upper lip than below the lower lip.  Your baby's tongue should be between his  or her lower gum and your breast.  Ensure that your baby's mouth is correctly positioned around your nipple (latched). Your baby's lips should create a seal on your breast and be turned out (everted).  It is common for your baby to suck about 2-3 minutes in order to start the flow of breast milk. Latching Teaching your baby how to latch on to your breast properly is very important. An improper latch can cause nipple pain and decreased milk supply for you and poor weight gain in your baby. Also, if your baby is not latched onto your nipple properly, he or she may swallow some air during feeding. This can make your baby fussy. Burping your baby when you switch breasts during the feeding can help to get rid of the air. However, teaching your baby to latch on properly is still the best way to prevent fussiness from swallowing air while breastfeeding. Signs that your baby has successfully latched on to your nipple:  Silent tugging or silent sucking, without causing you pain.  Swallowing heard between every 3-4 sucks.  Muscle movement above and in front of his or her ears while sucking. Signs that your baby has not successfully latched on to nipple:  Sucking sounds or smacking sounds from your baby while breastfeeding.  Nipple pain. If you think your baby has not latched on correctly, slip your finger into the corner of your baby's mouth to break the suction and place it between your baby's gums. Attempt breastfeeding initiation again. Signs of Successful Breastfeeding Signs from your baby:  A gradual decrease in the number of sucks or complete cessation of sucking.  Falling asleep.  Relaxation of his or her body.  Retention of a small amount of milk in his or her mouth.  Letting go of your breast by himself or herself. Signs from you:  Breasts that have increased in firmness, weight, and size 1-3 hours after feeding.  Breasts that are softer immediately after  breastfeeding.  Increased milk volume, as well as a change in milk consistency and color by the fifth day of breastfeeding.  Nipples that are not sore, cracked, or bleeding. Signs That Your Baby is Getting Enough Milk  Wetting at least 3 diapers in a 24-hour period.   The urine should be clear and pale yellow by age 5 days.  At least 3 stools in a 24-hour period by age 5 days. The stool should be soft and yellow.  At least 3 stools in a 24-hour period by age 7 days. The stool should be seedy and yellow.  No loss of weight greater than 10% of birth weight during the first 3 days of age.  Average weight gain of 4-7 ounces (113-198 g) per week after age 4 days.  Consistent daily weight gain by age 5 days, without weight loss after the age of 2 weeks. After a feeding, your baby may spit up a small amount. This is common. BREASTFEEDING FREQUENCY AND DURATION Frequent feeding will help you make more milk and can prevent sore nipples and breast engorgement. Breastfeed when you feel the need to reduce the fullness of your breasts or when your baby shows signs of hunger. This is called "breastfeeding on demand." Avoid introducing a pacifier to your baby while you are working to establish breastfeeding (the first 4-6 weeks after your baby is born). After this time you may choose to use a pacifier. Research has shown that pacifier use during the first year of a baby's life decreases the risk of sudden infant death syndrome (SIDS). Allow your baby to feed on each breast as long as he or she wants. Breastfeed until your baby is finished feeding. When your baby unlatches or falls asleep while feeding from the first breast, offer the second breast. Because newborns are often sleepy in the first few weeks of life, you may need to awaken your baby to get him or her to feed. Breastfeeding times will vary from baby to baby. However, the following rules can serve as a guide to help you ensure that your baby is  properly fed:  Newborns (babies 4 weeks of age or younger) may breastfeed every 1-3 hours.  Newborns should not go longer than 3 hours during the day or 5 hours during the night without breastfeeding.  You should breastfeed your baby a minimum of 8 times in a 24-hour period until you begin to introduce solid foods to your baby at around 6 months of age. BREAST MILK PUMPING Pumping and storing breast milk allows you to ensure that your baby is exclusively fed your breast milk, even at times when you are unable to breastfeed. This is especially important if you are going back to work while you are still breastfeeding or when you are not able to be present during feedings. Your lactation consultant can give you guidelines on how long it is safe to store breast milk. A breast pump is a machine that allows you to pump milk from your breast into a sterile bottle. The pumped breast milk can then be stored in a refrigerator or freezer. Some breast pumps are operated by hand, while others use electricity. Ask your lactation consultant which type will work best for you. Breast pumps can be purchased, but some hospitals and breastfeeding support groups lease breast pumps on a monthly basis. A lactation consultant can teach you how to hand express breast milk, if you prefer not to use a pump. CARING FOR YOUR BREASTS WHILE YOU BREASTFEED Nipples can become dry, cracked, and sore while breastfeeding. The following recommendations can help keep your breasts moisturized and healthy:  Avoid using soap on your nipples.  Wear a supportive bra. Although not required, special nursing bras and tank tops are designed to allow access to your   breasts for breastfeeding without taking off your entire bra or top. Avoid wearing underwire-style bras or extremely tight bras.  Air dry your nipples for 3-4minutes after each feeding.  Use only cotton bra pads to absorb leaked breast milk. Leaking of breast milk between feedings  is normal.  Use lanolin on your nipples after breastfeeding. Lanolin helps to maintain your skin's normal moisture barrier. If you use pure lanolin, you do not need to wash it off before feeding your baby again. Pure lanolin is not toxic to your baby. You may also hand express a few drops of breast milk and gently massage that milk into your nipples and allow the milk to air dry. In the first few weeks after giving birth, some women experience extremely full breasts (engorgement). Engorgement can make your breasts feel heavy, warm, and tender to the touch. Engorgement peaks within 3-5 days after you give birth. The following recommendations can help ease engorgement:  Completely empty your breasts while breastfeeding or pumping. You may want to start by applying warm, moist heat (in the shower or with warm water-soaked hand towels) just before feeding or pumping. This increases circulation and helps the milk flow. If your baby does not completely empty your breasts while breastfeeding, pump any extra milk after he or she is finished.  Wear a snug bra (nursing or regular) or tank top for 1-2 days to signal your body to slightly decrease milk production.  Apply ice packs to your breasts, unless this is too uncomfortable for you.  Make sure that your baby is latched on and positioned properly while breastfeeding. If engorgement persists after 48 hours of following these recommendations, contact your health care provider or a lactation consultant. OVERALL HEALTH CARE RECOMMENDATIONS WHILE BREASTFEEDING  Eat healthy foods. Alternate between meals and snacks, eating 3 of each per day. Because what you eat affects your breast milk, some of the foods may make your baby more irritable than usual. Avoid eating these foods if you are sure that they are negatively affecting your baby.  Drink milk, fruit juice, and water to satisfy your thirst (about 10 glasses a day).  Rest often, relax, and continue to take  your prenatal vitamins to prevent fatigue, stress, and anemia.  Continue breast self-awareness checks.  Avoid chewing and smoking tobacco. Chemicals from cigarettes that pass into breast milk and exposure to secondhand smoke may harm your baby.  Avoid alcohol and drug use, including marijuana. Some medicines that may be harmful to your baby can pass through breast milk. It is important to ask your health care provider before taking any medicine, including all over-the-counter and prescription medicine as well as vitamin and herbal supplements. It is possible to become pregnant while breastfeeding. If birth control is desired, ask your health care provider about options that will be safe for your baby. SEEK MEDICAL CARE IF:  You feel like you want to stop breastfeeding or have become frustrated with breastfeeding.  You have painful breasts or nipples.  Your nipples are cracked or bleeding.  Your breasts are red, tender, or warm.  You have a swollen area on either breast.  You have a fever or chills.  You have nausea or vomiting.  You have drainage other than breast milk from your nipples.  Your breasts do not become full before feedings by the fifth day after you give birth.  You feel sad and depressed.  Your baby is too sleepy to eat well.  Your baby is having trouble sleeping.     Your baby is wetting less than 3 diapers in a 24-hour period.  Your baby has less than 3 stools in a 24-hour period.  Your baby's skin or the white part of his or her eyes becomes yellow.   Your baby is not gaining weight by 5 days of age. SEEK IMMEDIATE MEDICAL CARE IF:  Your baby is overly tired (lethargic) and does not want to wake up and feed.  Your baby develops an unexplained fever.   This information is not intended to replace advice given to you by your health care provider. Make sure you discuss any questions you have with your health care provider.   Document Released: 03/06/2005  Document Revised: 11/25/2014 Document Reviewed: 08/28/2012 Elsevier Interactive Patient Education 2016 [employer]  

## 2015-08-17 NOTE — Progress Notes (Signed)
Subjective:  [name] is a 35 y.o. [phone] at [redacted]w[redacted]d being seen today for ongoing prenatal care.  She is currently monitored for the following issues for this low-risk pregnancy and has History of recurrent UTIs; Supervision of normal pregnancy, antepartum; Obesity in pregnancy, antepartum; Kidney stone complicating pregnancy; AMA (advanced maternal age) multigravida 35+; and Large for gestational age fetus affecting management of mother, antepartum on her problem list.  Patient reports no complaints.  Contractions: Irregular. Vag. Bleeding: None.  Movement: Present. Denies leaking of fluid.   The following portions of the patient's history were reviewed and updated as appropriate: allergies, current medications, past family history, past medical history, past social history, past surgical history and problem list. Problem list updated.  Objective:   Filed Vitals:   08/17/15 0850  BP: 109/74  Pulse: 84  Weight: 252 lb (114.306 kg)    Fetal Status: Fetal Heart Rate (bpm): 141 Fundal Height: 40 cm Movement: Present  Presentation: Vertex  General:  Alert, oriented and cooperative. Patient is in no acute distress.  Skin: Skin is warm and dry. No rash noted.   Cardiovascular: Normal heart rate noted  Respiratory: Normal respiratory effort, no problems with respiration noted  Abdomen: Soft, gravid, appropriate for gestational age. Pain/Pressure: Absent     Pelvic: Vag. Bleeding: None Vag D/C Character: Mucous   Cervical exam deferred        Extremities: Normal range of motion.  Edema: Deep pitting, indentation remains for a short time  Mental Status: Normal mood and affect. Normal behavior. Normal judgment and thought content.   Urinalysis: Urine Protein: Negative Urine Glucose: Negative  Assessment and Plan:  Pregnancy: [id number] at [redacted]w[redacted]d  1. Supervision of normal pregnancy, antepartum, third trimester Continue routine prenatal care.  2. Kidney stone complicating pregnancy, third  trimester Maintain hydration Pain meds prn  3. Large for gestational age fetus affecting management of mother, antepartum, third trimester, not applicable or unspecified fetus For repeat u/s at 39 wks  Preterm labor symptoms and general obstetric precautions including but not limited to vaginal bleeding, contractions, leaking of fluid and fetal movement were reviewed in detail with the patient. Please refer to After Visit Summary for other counseling recommendations.  Return in 1 week (on 08/24/2015).   Reva Bores, MD

## 2015-08-31 ENCOUNTER — Other Ambulatory Visit: Payer: Self-pay | Admitting: Obstetrics & Gynecology

## 2015-08-31 ENCOUNTER — Ambulatory Visit (HOSPITAL_COMMUNITY)
Admission: RE | Admit: 2015-08-31 | Discharge: 2015-08-31 | Disposition: A | Discharge status: Home / Self Care | Payer: Commercial Managed Care - PPO | Source: Ambulatory Visit | Attending: Obstetrics & Gynecology | Admitting: Obstetrics & Gynecology

## 2015-08-31 ENCOUNTER — Ambulatory Visit (INDEPENDENT_AMBULATORY_CARE_PROVIDER_SITE_OTHER): Payer: Commercial Managed Care - PPO | Admitting: Obstetrics & Gynecology

## 2015-08-31 ENCOUNTER — Encounter: Payer: Self-pay | Admitting: Obstetrics & Gynecology

## 2015-08-31 VITALS — BP 134/83 | HR 86 | Wt 255.0 lb

## 2015-08-31 DIAGNOSIS — O26843 Uterine size-date discrepancy, third trimester: Secondary | ICD-10-CM | POA: Insufficient documentation

## 2015-08-31 DIAGNOSIS — Z3483 Encounter for supervision of other normal pregnancy, third trimester: Secondary | ICD-10-CM

## 2015-08-31 DIAGNOSIS — O99213 Obesity complicating pregnancy, third trimester: Secondary | ICD-10-CM | POA: Insufficient documentation

## 2015-08-31 DIAGNOSIS — O3660X1 Maternal care for excessive fetal growth, unspecified trimester, fetus 1: Secondary | ICD-10-CM

## 2015-08-31 DIAGNOSIS — O3663X1 Maternal care for excessive fetal growth, third trimester, fetus 1: Secondary | ICD-10-CM

## 2015-08-31 DIAGNOSIS — Z3A38 38 weeks gestation of pregnancy: Secondary | ICD-10-CM

## 2015-08-31 DIAGNOSIS — O09523 Supervision of elderly multigravida, third trimester: Secondary | ICD-10-CM

## 2015-08-31 DIAGNOSIS — O09293 Supervision of pregnancy with other poor reproductive or obstetric history, third trimester: Secondary | ICD-10-CM | POA: Insufficient documentation

## 2015-08-31 NOTE — Patient Instructions (Signed)
Shoulder Dystocia Shoulder dystocia is when a baby's shoulders get stuck at the opening of the vagina after the head is delivered during labor. Shoulder dystocia is a medical emergency. It can cause serious injury to both the baby and the mother. WHO IS AT RISK FOR SHOULDER DYSTOCIA? This problem is more likely to develop if:  The mother has diabetes.  The mother has a very small pelvis.  The mother has a deformed pelvis.  The mother has a history of shoulder dystocia during delivery.  The mother is obese.  The mother is of advanced age. It is also more likely to develop if the baby weighs more than 8.5 lb (3.9 kg). WHAT ARE THE SIGNS OF SHOULDER DYSTOCIA? Signs of shoulder dystocia include:  Very slow movement of the baby down the birth canal.  Movement of the baby's head in and out of the opening of the vagina during contractions. This is called the "turtle sign." WHAT PROBLEMS CAN BE CAUSED BY SHOULDER DYSTOCIA? In the baby, shoulder dystocia can cause:  A break in the collarbone (clavicle).  Nerve damage that results in paralysis or loss of muscle control in the shoulders, arms, hands, or fingers.  Brain damage or death.  Speech disability. In the mother, shoulder dystocia can cause:  Severe tearing (laceration) of the vagina, rectum, or cervix.  Heavy bleeding (hemorrhage).  Infection.  Separation of the pubic bone.  Damage to nerves that control the ability to hold in stool and control stool. Most of the time, the problems that are caused by shoulder dystocia are not permanent. HOW IS SHOULDER DYSTOCIA TREATED? A health care provider will try several techniques to deliver the baby safely. These techniques include:  Moving the baby's shoulder.  Pressing against the mother's abdomen.  Repositioning the mother.  Making a surgical incision in the vaginal opening (episiotomy) to get the baby's shoulders through the vagina.   This information is not intended to  replace advice given to you by your health care provider. Make sure you discuss any questions you have with your health care provider.   Document Released: 08/27/2006 Document Revised: 11/25/2014 Document Reviewed: 06/29/2014 Elsevier Interactive Patient Education [employer]   Cesarean Delivery Cesarean delivery is the birth of a baby through a cut (incision) in the abdomen and womb (uterus).  LET [facility] CARE PROVIDER KNOW ABOUT:  All medicines you are taking, including vitamins, herbs, eye drops, creams, and over-the-counter medicines.  Previous problems you or members of your family have had with the use of anesthetics.  Any bleeding or blood clotting disorders you have.  Family history of blood clots or bleeding disorders.  Any history of deep vein thrombosis (DVT) or pulmonary embolism (PE).  Previous surgeries you have had.  Medical conditions you have.  Any allergies you have.  Complicationsinvolving the pregnancy. RISKS AND COMPLICATIONS  Generally, this is a safe procedure. However, as with any procedure, complications can occur. Possible complications include:  Bleeding.  Infection.  Blood clots.  Injury to surrounding organs.  Problems with anesthesia.  Injury to the baby. BEFORE THE PROCEDURE   You may be given an antacid medicine to drink. This will prevent acid contents in your stomach from going into your lungs if you vomit during the surgery.  You may be given an antibiotic medicine to prevent infection. PROCEDURE   To prevent infection of your incision:  Hair may be removed from your pubic area if it is near your incision.  The skin of  your pubic area and lower abdomen will be cleaned with a germ-killing solution (antiseptic).  A tube (Foley catheter) will be placed in your bladder to drain your urine from your bladder into a bag. This keeps your bladder empty during surgery.  An IV tube will be placed in your vein.  You may be  given medicine to numb the lower half of your body (regional anesthetic). If you were in labor, you may have already had an epidural in place which can be used in both labor and cesarean delivery. You may possibly be given medicine to make you sleep (general anesthetic) though this is not as common.  Your heart rate and your baby's heart rate will be monitored.  An incision will be made in your abdomen that extends to your uterus. There are 2 basic kinds of incisions:  The horizontal (transverse) incision. Horizontal incisions are from side to side and are used for most routine cesarean deliveries.  The vertical incision. The vertical incision is from the top of the abdomen to the bottom and is less commonly used. It is often done for women who have a serious complication (extreme prematurity) or under emergency situations.  The horizontal and vertical incisions may both be used at the same time. However, this is very uncommon.  An incision is then made in your uterus to deliver the baby.  Your baby will be delivered.  Your health care provider may place the baby on your chest. It is important to keep the baby warm. Your health care provider will dry off the baby, place the baby directly on your bare skin, and cover the baby with warm, dry blankets.  Both incisions will be closed with absorbable stitches. AFTER THE PROCEDURE   If you were awake during the surgery, you will see your baby right away. If you were asleep, you will see your baby as soon as you are awake.  You may breastfeed your baby after surgery.  You may be able to get up and walk the same day as the surgery. If you need to stay in bed for a period of time, you will receive help to turn, cough, and take deep breaths after surgery. This helps prevent lung problems such as pneumonia.  Do not get out of bed alone the first time after surgery. You will need help getting out of bed until you are able to do this by  yourself.  You may be able to shower the day after your cesarean delivery. After the bandage (dressing) is taken off the incision site, a nurse will assist you to shower if you would like help.  You may be directed to take actions to help prevent blood clots in your legs. These may include:  Walking shortly after surgery, with someone assisting you. Moving around after surgery helps to improve blood flow.  Wearing compression stockings or using different types of devices.  Taking medicines to thin your blood (anticoagulants) if you are at high risk for DVT or PE.  Save any blood clots that you pass from your vagina. If you pass a clot while on the toilet, do not flush it. Call for the nurse. Tell the nurse if you think you are bleeding too much or passing too many clots.  You will be given medicine for pain and nausea as needed. Let your health care providers know if you are hurting. You may also be given an antibiotic to prevent an infection.  Your IV tube will be  taken out when you are drinking a reasonable amount of fluids. The Foley catheter is taken out when you are up and walking.  If your blood type is Rh negative and your baby's blood type is Rh positive, you will be given a shot of anti-D immune globulin. This shot prevents you from having Rh problems with a future pregnancy. You should get the shot even if you had your tubes tied (tubal ligation).  If you are allowed to take the baby for a walk, place the baby in the bassinet and push it.   This information is not intended to replace advice given to you by your health care provider. Make sure you discuss any questions you have with your health care provider.   Document Released: 03/06/2005 Document Revised: 11/25/2014 Document Reviewed: 11/01/2011 Elsevier Interactive Patient Education [employer]

## 2015-08-31 NOTE — Addendum Note (Signed)
Addended by: Jaynie Collins A on: 08/31/2015 04:49 PM   Modules accepted: Orders

## 2015-08-31 NOTE — Progress Notes (Signed)
Subjective:  DEVOIRY REISCH is a 35 y.o. [phone] at [redacted]w[redacted]d being seen today for ongoing prenatal care.  She is currently monitored for the following issues for this low-risk pregnancy and has History of recurrent UTIs; Supervision of normal pregnancy, antepartum; Obesity in pregnancy, antepartum; Kidney stone complicating pregnancy; AMA (advanced maternal age) multigravida 35+; Large for gestational age fetus affecting management of mother, antepartum; and History of shoulder dystocia in prior pregnancy, currently pregnant in third trimester on her problem list.  Patient reports no complaints.  Contractions: Irregular. Vag. Bleeding: None.  Movement: Present. Denies leaking of fluid.   She is very concerned about ultrasound today showing an EFW of 11 lb.  The following portions of the patient's history were reviewed and updated as appropriate: allergies, current medications, past family history, past medical history, past social history, past surgical history and problem list. Problem list updated.  Objective:   Filed Vitals:   08/31/15 1410  BP: 134/83  Pulse: 86  Weight: 255 lb (115.667 kg)    Fetal Status: Fetal Heart Rate (bpm): 135 Fundal Height: 43 cm Movement: Present  Presentation: Vertex  General:  Alert, oriented and cooperative. Patient is in no acute distress.  Skin: Skin is warm and dry. No rash noted.   Cardiovascular: Normal heart rate noted  Respiratory: Normal respiratory effort, no problems with respiration noted  Abdomen: Soft, gravid, appropriate for gestational age. Pain/Pressure: Present     Pelvic: Cervical exam performed Dilation: 1 Effacement (%): 50 Station: -3  Extremities: Normal range of motion.  Edema: Deep pitting, indentation remains for a short time  Mental Status: Normal mood and affect. Normal behavior. Normal judgment and thought content.   Urinalysis: Urine Protein: Negative Urine Glucose: Negative 08/31/2015 [redacted]w[redacted]d EFW 4993g (11-0) >99th%ile; AC  >99%ile, AFI 16.4 cm, cephalic  Assessment and Plan:  Pregnancy: [id number] at [redacted]w[redacted]d  1. Large for gestational age fetus affecting management of mother, antepartum, third trimester, fetus 1 2. History of shoulder dystocia in prior pregnancy, currently pregnant in third trimester Discussed concern about macrosomia, increased risk of shoulder dystocia (especially given previous history), risk of nerve injury and fetal detail emphasized.  Patient desires induction of labor for now, will also consider cesarean section. Information given to her to review at home.  IOL scheduled at 39 weeks 09/02/15 at midnight. Will follow labor curve closely, no operative vaginal delivery.  Plan was also discussed with Dr. Eulis Foster (MFM).  3. Supervision of normal pregnancy, antepartum, third trimester Term labor symptoms and general obstetric precautions including but not limited to vaginal bleeding, contractions, leaking of fluid and fetal movement were reviewed in detail with the patient. Please refer to After Visit Summary for other counseling recommendations.  Return in about 6 weeks (around 10/12/2015) for Postpartum check.   Tereso Newcomer, MD

## 2015-09-01 ENCOUNTER — Encounter (HOSPITAL_COMMUNITY): Payer: Self-pay | Admitting: General Practice

## 2015-09-01 ENCOUNTER — Telehealth (HOSPITAL_COMMUNITY): Payer: Self-pay | Admitting: *Deleted

## 2015-09-01 ENCOUNTER — Telehealth: Payer: Self-pay | Admitting: *Deleted

## 2015-09-01 ENCOUNTER — Telehealth (HOSPITAL_COMMUNITY): Payer: Self-pay | Admitting: General Practice

## 2015-09-01 ENCOUNTER — Inpatient Hospital Stay (HOSPITAL_COMMUNITY): Admission: RE | Admit: 2015-09-01 | Payer: Commercial Managed Care - PPO | Source: Ambulatory Visit

## 2015-09-01 NOTE — Telephone Encounter (Signed)
Preadmission screen  

## 2015-09-01 NOTE — Telephone Encounter (Signed)
Pt called in stating she no longer desires TOL and would like Section scheduled. I called attending, Shawnie Pons, to see what we should do about getting her scheduled. Shawnie Pons is to call back to advise.

## 2015-09-01 NOTE — Telephone Encounter (Signed)
Pt called in stating she has questions about her induction tonight. I called pt back LMOM for her to rtn call if needed.

## 2015-09-02 ENCOUNTER — Inpatient Hospital Stay (HOSPITAL_COMMUNITY): Payer: Commercial Managed Care - PPO | Admitting: Anesthesiology

## 2015-09-02 ENCOUNTER — Encounter (HOSPITAL_COMMUNITY): Payer: Self-pay | Admitting: *Deleted

## 2015-09-02 ENCOUNTER — Encounter: Payer: Commercial Managed Care - PPO | Admitting: Obstetrics and Gynecology

## 2015-09-02 ENCOUNTER — Inpatient Hospital Stay (HOSPITAL_COMMUNITY): Admission: RE | Admit: 2015-09-02 | Payer: Commercial Managed Care - PPO | Source: Ambulatory Visit

## 2015-09-02 ENCOUNTER — Inpatient Hospital Stay (HOSPITAL_COMMUNITY)
Admission: RE | Admit: 2015-09-02 | Discharge: 2015-09-04 | DRG: 766 | Disposition: A | Discharge status: Home / Self Care | Payer: Commercial Managed Care - PPO | Source: Ambulatory Visit | Attending: Obstetrics & Gynecology | Admitting: Obstetrics & Gynecology

## 2015-09-02 ENCOUNTER — Encounter (HOSPITAL_COMMUNITY)
Admission: RE | Disposition: A | Discharge status: Home / Self Care | Payer: Self-pay | Source: Ambulatory Visit | Attending: Obstetrics & Gynecology

## 2015-09-02 DIAGNOSIS — Z302 Encounter for sterilization: Secondary | ICD-10-CM

## 2015-09-02 DIAGNOSIS — O99210 Obesity complicating pregnancy, unspecified trimester: Secondary | ICD-10-CM | POA: Diagnosis present

## 2015-09-02 DIAGNOSIS — Z833 Family history of diabetes mellitus: Secondary | ICD-10-CM

## 2015-09-02 DIAGNOSIS — Z820 Family history of epilepsy and other diseases of the nervous system: Secondary | ICD-10-CM | POA: Diagnosis not present

## 2015-09-02 DIAGNOSIS — Z87442 Personal history of urinary calculi: Secondary | ICD-10-CM

## 2015-09-02 DIAGNOSIS — O3660X0 Maternal care for excessive fetal growth, unspecified trimester, not applicable or unspecified: Secondary | ICD-10-CM | POA: Diagnosis present

## 2015-09-02 DIAGNOSIS — O3663X0 Maternal care for excessive fetal growth, third trimester, not applicable or unspecified: Principal | ICD-10-CM | POA: Diagnosis present

## 2015-09-02 DIAGNOSIS — Z8249 Family history of ischemic heart disease and other diseases of the circulatory system: Secondary | ICD-10-CM | POA: Diagnosis not present

## 2015-09-02 DIAGNOSIS — Z87891 Personal history of nicotine dependence: Secondary | ICD-10-CM

## 2015-09-02 DIAGNOSIS — Z3A39 39 weeks gestation of pregnancy: Secondary | ICD-10-CM

## 2015-09-02 DIAGNOSIS — O9962 Diseases of the digestive system complicating childbirth: Secondary | ICD-10-CM | POA: Diagnosis present

## 2015-09-02 DIAGNOSIS — K219 Gastro-esophageal reflux disease without esophagitis: Secondary | ICD-10-CM | POA: Diagnosis present

## 2015-09-02 DIAGNOSIS — O09293 Supervision of pregnancy with other poor reproductive or obstetric history, third trimester: Secondary | ICD-10-CM

## 2015-09-02 DIAGNOSIS — Z3490 Encounter for supervision of normal pregnancy, unspecified, unspecified trimester: Secondary | ICD-10-CM

## 2015-09-02 DIAGNOSIS — Z98891 History of uterine scar from previous surgery: Secondary | ICD-10-CM

## 2015-09-02 DIAGNOSIS — Z87440 Personal history of urinary (tract) infections: Secondary | ICD-10-CM | POA: Diagnosis present

## 2015-09-02 DIAGNOSIS — O09529 Supervision of elderly multigravida, unspecified trimester: Secondary | ICD-10-CM

## 2015-09-02 HISTORY — PX: TUBAL LIGATION: SHX77

## 2015-09-02 LAB — CBC
HCT: 34.5 % — ABNORMAL LOW (ref 36.0–46.0)
Hemoglobin: 11.5 g/dL — ABNORMAL LOW (ref 12.0–15.0)
MCH: 26.6 pg (ref 26.0–34.0)
MCHC: 33.3 g/dL (ref 30.0–36.0)
MCV: 79.7 fL (ref 78.0–100.0)
Platelets: 292 10*3/uL (ref 150–400)
RBC: 4.33 MIL/uL (ref 3.87–5.11)
RDW: 13.4 % (ref 11.5–15.5)
WBC: 12.9 10*3/uL — AB (ref 4.0–10.5)

## 2015-09-02 LAB — TYPE AND SCREEN
ABO/RH(D): O POS
ANTIBODY SCREEN: NEGATIVE

## 2015-09-02 LAB — ABO/RH: ABO/RH(D): O POS

## 2015-09-02 SURGERY — LIGATION, FALLOPIAN TUBE, BILATERAL
Anesthesia: Spinal

## 2015-09-02 MED ORDER — PHENYLEPHRINE 8 MG IN D5W 100 ML (0.08MG/ML) PREMIX OPTIME
INJECTION | INTRAVENOUS | Status: DC | PRN
  Administered 2015-09-02: 60 ug/min via INTRAVENOUS

## 2015-09-02 MED ORDER — FENTANYL CITRATE (PF) 100 MCG/2ML IJ SOLN
INTRAMUSCULAR | Status: AC
  Filled 2015-09-02: qty 2

## 2015-09-02 MED ORDER — COCONUT OIL OIL
1.0000 "application " | TOPICAL_OIL | Status: DC | PRN
  Administered 2015-09-03: 1 via TOPICAL
  Filled 2015-09-02: qty 120

## 2015-09-02 MED ORDER — LACTATED RINGERS IV SOLN
INTRAVENOUS | Status: DC
  Administered 2015-09-02 (×2): via INTRAVENOUS

## 2015-09-02 MED ORDER — NALBUPHINE HCL 10 MG/ML IJ SOLN: 5.0000 mg | INTRAMUSCULAR | Status: DC | PRN

## 2015-09-02 MED ORDER — SIMETHICONE 80 MG PO CHEW
80.0000 mg | CHEWABLE_TABLET | ORAL | Status: DC | PRN
  Administered 2015-09-02 – 2015-09-03 (×3): 80 mg via ORAL
  Filled 2015-09-02 (×3): qty 1

## 2015-09-02 MED ORDER — PHENYLEPHRINE 8 MG IN D5W 100 ML (0.08MG/ML) PREMIX OPTIME
INJECTION | INTRAVENOUS | Status: AC
  Filled 2015-09-02: qty 100

## 2015-09-02 MED ORDER — LACTATED RINGERS IV SOLN
Freq: Once | INTRAVENOUS | Status: AC
  Administered 2015-09-02: 10:00:00 via INTRAVENOUS

## 2015-09-02 MED ORDER — KETOROLAC TROMETHAMINE 30 MG/ML IJ SOLN
30.0000 mg | Freq: Four times a day (QID) | INTRAMUSCULAR | Status: DC | PRN
Start: 2015-09-02 — End: 2015-09-02

## 2015-09-02 MED ORDER — FENTANYL CITRATE (PF) 100 MCG/2ML IJ SOLN
25.0000 ug | INTRAMUSCULAR | Status: DC | PRN
  Administered 2015-09-02: 50 ug via INTRAVENOUS

## 2015-09-02 MED ORDER — OXYTOCIN 40 UNITS IN LACTATED RINGERS INFUSION - SIMPLE MED: 2.5000 [IU]/h | INTRAVENOUS | Status: AC

## 2015-09-02 MED ORDER — NALBUPHINE HCL 10 MG/ML IJ SOLN
INTRAMUSCULAR | Status: AC
  Filled 2015-09-02: qty 1

## 2015-09-02 MED ORDER — NALBUPHINE HCL 10 MG/ML IJ SOLN
5.0000 mg | Freq: Once | INTRAMUSCULAR | Status: AC | PRN
  Administered 2015-09-02: 5 mg via SUBCUTANEOUS

## 2015-09-02 MED ORDER — SIMETHICONE 80 MG PO CHEW
80.0000 mg | CHEWABLE_TABLET | ORAL | Status: DC
  Administered 2015-09-02 – 2015-09-03 (×2): 80 mg via ORAL
  Filled 2015-09-02 (×2): qty 1

## 2015-09-02 MED ORDER — MORPHINE SULFATE (PF) 0.5 MG/ML IJ SOLN
INTRAMUSCULAR | Status: AC
  Filled 2015-09-02: qty 10

## 2015-09-02 MED ORDER — MENTHOL 3 MG MT LOZG
1.0000 | LOZENGE | OROMUCOSAL | Status: DC | PRN
  Filled 2015-09-02: qty 9

## 2015-09-02 MED ORDER — MAGNESIUM HYDROXIDE 400 MG/5ML PO SUSP: 30.0000 mL | ORAL | Status: DC | PRN

## 2015-09-02 MED ORDER — DEXTROSE 5 % IV SOLN
2.0000 g | INTRAVENOUS | Status: AC
  Administered 2015-09-02: 2 g via INTRAVENOUS
  Filled 2015-09-02: qty 2

## 2015-09-02 MED ORDER — OXYCODONE-ACETAMINOPHEN 5-325 MG PO TABS
1.0000 | ORAL_TABLET | ORAL | Status: DC | PRN
  Administered 2015-09-03 (×3): 1 via ORAL
  Filled 2015-09-02 (×3): qty 1

## 2015-09-02 MED ORDER — SENNOSIDES-DOCUSATE SODIUM 8.6-50 MG PO TABS
2.0000 | ORAL_TABLET | ORAL | Status: DC
  Administered 2015-09-02 – 2015-09-03 (×2): 2 via ORAL
  Filled 2015-09-02 (×2): qty 2

## 2015-09-02 MED ORDER — MORPHINE SULFATE (PF) 0.5 MG/ML IJ SOLN
INTRAMUSCULAR | Status: DC | PRN
  Administered 2015-09-02: .2 mg via INTRATHECAL

## 2015-09-02 MED ORDER — PRENATAL MULTIVITAMIN CH
1.0000 | ORAL_TABLET | Freq: Every day | ORAL | Status: DC
  Administered 2015-09-03: 1 via ORAL
  Filled 2015-09-02 (×2): qty 1

## 2015-09-02 MED ORDER — NALOXONE HCL 2 MG/2ML IJ SOSY: 1.0000 ug/kg/h | PREFILLED_SYRINGE | INTRAVENOUS | Status: DC | PRN

## 2015-09-02 MED ORDER — FERROUS SULFATE 325 (65 FE) MG PO TABS
325.0000 mg | ORAL_TABLET | Freq: Two times a day (BID) | ORAL | Status: DC
Start: 2015-09-02 — End: 2015-09-04
  Administered 2015-09-03 (×2): 325 mg via ORAL
  Filled 2015-09-02 (×4): qty 1

## 2015-09-02 MED ORDER — DIPHENHYDRAMINE HCL 25 MG PO CAPS
25.0000 mg | ORAL_CAPSULE | ORAL | Status: DC | PRN
Start: 2015-09-02 — End: 2015-09-02
  Filled 2015-09-02: qty 1

## 2015-09-02 MED ORDER — ZOLPIDEM TARTRATE 5 MG PO TABS: 5.0000 mg | ORAL_TABLET | Freq: Every evening | ORAL | Status: DC | PRN

## 2015-09-02 MED ORDER — FENTANYL CITRATE (PF) 100 MCG/2ML IJ SOLN
INTRAMUSCULAR | Status: DC | PRN
  Administered 2015-09-02: 10 ug via INTRATHECAL

## 2015-09-02 MED ORDER — OXYCODONE-ACETAMINOPHEN 5-325 MG PO TABS
2.0000 | ORAL_TABLET | ORAL | Status: DC | PRN
  Administered 2015-09-03 – 2015-09-04 (×5): 2 via ORAL
  Filled 2015-09-02 (×5): qty 2

## 2015-09-02 MED ORDER — MEPERIDINE HCL 25 MG/ML IJ SOLN: 6.2500 mg | INTRAMUSCULAR | Status: DC | PRN

## 2015-09-02 MED ORDER — SCOPOLAMINE 1 MG/3DAYS TD PT72
MEDICATED_PATCH | TRANSDERMAL | Status: AC
  Administered 2015-09-02: 1.5 mg via TRANSDERMAL
  Filled 2015-09-02: qty 1

## 2015-09-02 MED ORDER — SCOPOLAMINE 1 MG/3DAYS TD PT72
1.0000 | MEDICATED_PATCH | Freq: Once | TRANSDERMAL | Status: DC
  Administered 2015-09-02: 1.5 mg via TRANSDERMAL

## 2015-09-02 MED ORDER — KETOROLAC TROMETHAMINE 30 MG/ML IJ SOLN: 30.0000 mg | Freq: Four times a day (QID) | INTRAMUSCULAR | Status: DC | PRN

## 2015-09-02 MED ORDER — IBUPROFEN 600 MG PO TABS
600.0000 mg | ORAL_TABLET | Freq: Four times a day (QID) | ORAL | Status: DC
  Administered 2015-09-02 – 2015-09-04 (×8): 600 mg via ORAL
  Filled 2015-09-02 (×8): qty 1

## 2015-09-02 MED ORDER — DIPHENHYDRAMINE HCL 50 MG/ML IJ SOLN: 12.5000 mg | INTRAMUSCULAR | Status: DC | PRN

## 2015-09-02 MED ORDER — MEASLES, MUMPS & RUBELLA VAC ~~LOC~~ INJ
0.5000 mL | INJECTION | Freq: Once | SUBCUTANEOUS | Status: DC
  Filled 2015-09-02: qty 0.5

## 2015-09-02 MED ORDER — LACTATED RINGERS IV SOLN: INTRAVENOUS | Status: DC

## 2015-09-02 MED ORDER — SCOPOLAMINE 1 MG/3DAYS TD PT72: 1.0000 | MEDICATED_PATCH | Freq: Once | TRANSDERMAL | Status: DC

## 2015-09-02 MED ORDER — KETOROLAC TROMETHAMINE 30 MG/ML IJ SOLN
INTRAMUSCULAR | Status: AC
  Filled 2015-09-02: qty 1

## 2015-09-02 MED ORDER — ONDANSETRON HCL 4 MG/2ML IJ SOLN
INTRAMUSCULAR | Status: DC | PRN
  Administered 2015-09-02: 4 mg via INTRAVENOUS

## 2015-09-02 MED ORDER — WITCH HAZEL-GLYCERIN EX PADS: 1.0000 "application " | MEDICATED_PAD | CUTANEOUS | Status: DC | PRN

## 2015-09-02 MED ORDER — BUPIVACAINE IN DEXTROSE 0.75-8.25 % IT SOLN
INTRATHECAL | Status: DC | PRN
  Administered 2015-09-02: 1.6 mg via INTRATHECAL

## 2015-09-02 MED ORDER — BUPIVACAINE HCL (PF) 0.5 % IJ SOLN
INTRAMUSCULAR | Status: AC
  Filled 2015-09-02: qty 30

## 2015-09-02 MED ORDER — TETANUS-DIPHTH-ACELL PERTUSSIS 5-2.5-18.5 LF-MCG/0.5 IM SUSP: 0.5000 mL | Freq: Once | INTRAMUSCULAR | Status: DC

## 2015-09-02 MED ORDER — NALOXONE HCL 0.4 MG/ML IJ SOLN: 0.4000 mg | INTRAMUSCULAR | Status: DC | PRN

## 2015-09-02 MED ORDER — DIBUCAINE 1 % RE OINT: 1.0000 "application " | TOPICAL_OINTMENT | RECTAL | Status: DC | PRN

## 2015-09-02 MED ORDER — NALBUPHINE HCL 10 MG/ML IJ SOLN: 5.0000 mg | Freq: Once | INTRAMUSCULAR | Status: AC | PRN

## 2015-09-02 MED ORDER — SODIUM CHLORIDE 0.9% FLUSH
3.0000 mL | INTRAVENOUS | Status: DC | PRN
Start: 2015-09-02 — End: 2015-09-02

## 2015-09-02 MED ORDER — BUPIVACAINE HCL (PF) 0.5 % IJ SOLN
INTRAMUSCULAR | Status: DC | PRN
  Administered 2015-09-02: 30 mL

## 2015-09-02 MED ORDER — OXYTOCIN 10 UNIT/ML IJ SOLN
INTRAMUSCULAR | Status: AC
  Filled 2015-09-02: qty 4

## 2015-09-02 MED ORDER — ONDANSETRON HCL 4 MG/2ML IJ SOLN
INTRAMUSCULAR | Status: AC
  Filled 2015-09-02: qty 2

## 2015-09-02 MED ORDER — ONDANSETRON HCL 4 MG/2ML IJ SOLN
4.0000 mg | Freq: Three times a day (TID) | INTRAMUSCULAR | Status: DC | PRN
Start: 2015-09-02 — End: 2015-09-02

## 2015-09-02 MED ORDER — DIPHENHYDRAMINE HCL 25 MG PO CAPS
25.0000 mg | ORAL_CAPSULE | Freq: Four times a day (QID) | ORAL | Status: DC | PRN
  Administered 2015-09-02 – 2015-09-03 (×2): 25 mg via ORAL
  Filled 2015-09-02 (×2): qty 1

## 2015-09-02 MED ORDER — PROMETHAZINE HCL 25 MG/ML IJ SOLN: 6.2500 mg | INTRAMUSCULAR | Status: DC | PRN

## 2015-09-02 MED ORDER — OXYTOCIN 10 UNIT/ML IJ SOLN
40.0000 [IU] | INTRAVENOUS | Status: DC | PRN
  Administered 2015-09-02: 40 [IU] via INTRAVENOUS

## 2015-09-02 MED ORDER — ACETAMINOPHEN 325 MG PO TABS: 650.0000 mg | ORAL_TABLET | ORAL | Status: DC | PRN

## 2015-09-02 SURGICAL SUPPLY — 36 items
APL SKNCLS STERI-STRIP NONHPOA (GAUZE/BANDAGES/DRESSINGS) ×2
BENZOIN TINCTURE PRP APPL 2/3 (GAUZE/BANDAGES/DRESSINGS) ×1 IMPLANT
CLAMP CORD UMBIL (MISCELLANEOUS) IMPLANT
CLIP FILSHIE TUBAL LIGA STRL (Clip) ×1 IMPLANT
CLOTH BEACON ORANGE TIMEOUT ST (SAFETY) ×3 IMPLANT
DRSG OPSITE POSTOP 4X10 (GAUZE/BANDAGES/DRESSINGS) ×3 IMPLANT
DURAPREP 26ML APPLICATOR (WOUND CARE) ×3 IMPLANT
ELECT REM PT RETURN 9FT ADLT (ELECTROSURGICAL) ×3
ELECTRODE REM PT RTRN 9FT ADLT (ELECTROSURGICAL) ×2 IMPLANT
EXTRACTOR VACUUM M CUP 4 TUBE (SUCTIONS) IMPLANT
GAUZE SPONGE 4X4 12PLY STRL LF (GAUZE/BANDAGES/DRESSINGS) ×1 IMPLANT
GLOVE BIOGEL PI IND STRL 7.0 (GLOVE) ×6 IMPLANT
GLOVE BIOGEL PI INDICATOR 7.0 (GLOVE) ×3
GLOVE ECLIPSE 7.0 STRL STRAW (GLOVE) ×3 IMPLANT
GOWN STRL REUS W/TWL LRG LVL3 (GOWN DISPOSABLE) ×6 IMPLANT
KIT ABG SYR 3ML LUER SLIP (SYRINGE) IMPLANT
NDL HYPO 25X5/8 SAFETYGLIDE (NEEDLE) ×2 IMPLANT
NEEDLE HYPO 22GX1.5 SAFETY (NEEDLE) ×3 IMPLANT
NEEDLE HYPO 25X5/8 SAFETYGLIDE (NEEDLE) ×3 IMPLANT
NS IRRIG 1000ML POUR BTL (IV SOLUTION) ×3 IMPLANT
PACK C SECTION WH (CUSTOM PROCEDURE TRAY) ×3 IMPLANT
PAD ABD 7.5X8 STRL (GAUZE/BANDAGES/DRESSINGS) ×3 IMPLANT
PAD ABD 8X7 1/2 STERILE (GAUZE/BANDAGES/DRESSINGS) ×1 IMPLANT
PAD OB MATERNITY 4.3X12.25 (PERSONAL CARE ITEMS) ×3 IMPLANT
PENCIL SMOKE EVAC W/HOLSTER (ELECTROSURGICAL) ×3 IMPLANT
RTRCTR C-SECT PINK 25CM LRG (MISCELLANEOUS) ×1 IMPLANT
STRIP CLOSURE SKIN 1/2X4 (GAUZE/BANDAGES/DRESSINGS) ×2 IMPLANT
SUT PDS AB 0 CTX 36 PDP370T (SUTURE) ×3 IMPLANT
SUT PLAIN 2 0 XLH (SUTURE) ×1 IMPLANT
SUT VIC AB 0 CTX 36 (SUTURE) ×9
SUT VIC AB 0 CTX36XBRD ANBCTRL (SUTURE) ×6 IMPLANT
SUT VIC AB 2-0 CT1 (SUTURE) ×1 IMPLANT
SUT VIC AB 4-0 KS 27 (SUTURE) ×3 IMPLANT
SYR CONTROL 10ML LL (SYRINGE) ×3 IMPLANT
TOWEL OR 17X24 6PK STRL BLUE (TOWEL DISPOSABLE) ×3 IMPLANT
TRAY FOLEY CATH SILVER 14FR (SET/KITS/TRAYS/PACK) ×3 IMPLANT

## 2015-09-02 NOTE — Lactation Note (Signed)
This note was copied from a baby's chart. Lactation Consultation Note  Initial visit made.  Breastfeeding consultation services and support information given and reviewed.  This is mom's third baby and she breastfed her previous babies.  Newborn is 5 hours old and mom states she has fed well x 3.  Reviewed feeding cues and breast massage to increase milk flow.  Encouraged to call with concerns/assist prn.  Patient Name: [name] Today's Date: 09/02/2015 Reason for consult: Initial assessment   Maternal Data Does the patient have breastfeeding experience prior to this delivery?: Yes  Feeding    LATCH Score/Interventions Latch: Repeated attempts needed to sustain latch, nipple held in mouth throughout feeding, stimulation needed to elicit sucking reflex.     Type of Nipple: Everted at rest and after stimulation  Comfort (Breast/Nipple): Soft / non-tender     Hold (Positioning): Assistance needed to correctly position infant at breast and maintain latch.     Lactation Tools Discussed/Used     Consult Status Consult Status: PRN    [name] 09/02/2015, 4:21 PM

## 2015-09-02 NOTE — Anesthesia Postprocedure Evaluation (Signed)
Anesthesia Post Note  Patient: [name]  Procedure(s) Performed: Procedure(s) (LRB): CESAREAN SECTION (N/A) BILATERAL TUBAL LIGATION  Patient location during evaluation: PACU Anesthesia Type: Spinal Level of consciousness: oriented and awake and alert Pain management: pain level controlled Vital Signs Assessment: post-procedure vital signs reviewed and stable Respiratory status: spontaneous breathing, respiratory function stable and patient connected to nasal cannula oxygen Cardiovascular status: blood pressure returned to baseline and stable Postop Assessment: no headache, no backache and spinal receding Anesthetic complications: no     Last Vitals:  Filed Vitals:   09/02/15 1245 09/02/15 1252  BP: 112/84   Pulse: 70   Temp:  36.6 C  Resp: 18     Last Pain:  Filed Vitals:   09/02/15 1255  PainSc: 4    Pain Goal: Patients Stated Pain Goal: 3 (09/02/15 0921)               Shelton Silvas

## 2015-09-02 NOTE — Anesthesia Postprocedure Evaluation (Signed)
Anesthesia Post Note  Patient: [name]  Procedure(s) Performed: Procedure(s) (LRB): CESAREAN SECTION (N/A) BILATERAL TUBAL LIGATION  Patient location during evaluation: Mother Baby Anesthesia Type: Spinal Level of consciousness: awake and alert and oriented Pain management: satisfactory to patient Vital Signs Assessment: post-procedure vital signs reviewed and stable Respiratory status: respiratory function stable and spontaneous breathing Cardiovascular status: blood pressure returned to baseline Postop Assessment: no headache, no backache, spinal receding, patient able to bend at knees and adequate PO intake Anesthetic complications: no     Last Vitals:  Filed Vitals:   09/02/15 1728 09/02/15 1731  BP: 107/85 109/69  Pulse: 91 90  Temp:    Resp:      Last Pain:  Filed Vitals:   09/02/15 1853  PainSc: 2    Pain Goal: Patients Stated Pain Goal: 3 (09/02/15 1852)               Karleen Dolphin

## 2015-09-02 NOTE — Transfer of Care (Signed)
Immediate Anesthesia Transfer of Care Note  Patient: [name]  Procedure(s) Performed: Procedure(s): CESAREAN SECTION (N/A) BILATERAL TUBAL LIGATION  Patient Location: PACU  Anesthesia Type:Spinal  Level of Consciousness: awake  Airway & Oxygen Therapy: Patient Spontanous Breathing  Post-op Assessment: Report given to RN  Post vital signs: Reviewed and stable  Last Vitals:  Filed Vitals:   09/02/15 0921  BP: 125/85  Pulse: 85  Temp: 36.4 C  Resp: 18    Last Pain: There were no vitals filed for this visit.    Patients Stated Pain Goal: 3 (09/02/15 1610)  Complications: No apparent anesthesia complications

## 2015-09-02 NOTE — Anesthesia Procedure Notes (Signed)
Spinal Patient location during procedure: OR Start time: 09/02/2015 10:48 AM End time: 09/02/2015 10:49 AM Staffing Anesthesiologist: Suella Broad D Performed by: anesthesiologist  Preanesthetic Checklist Completed: patient identified, site marked, surgical consent, pre-op evaluation, timeout performed, IV checked, risks and benefits discussed and monitors and equipment checked Spinal Block Patient position: sitting Prep: Betadine Patient monitoring: heart rate, continuous pulse ox, blood pressure and cardiac monitor Approach: midline Location: L4-5 Injection technique: single-shot Needle Needle type: Whitacre and Introducer  Needle gauge: 24 G Needle length: 9 cm Additional Notes Negative paresthesia. Negative blood return. Positive free-flowing CSF. Expiration date of kit checked and confirmed. Patient tolerated procedure well, without complications.

## 2015-09-02 NOTE — Addendum Note (Signed)
Addendum  created 09/02/15 1943 by Graciela Husbands, CRNA   Modules edited: Notes Section   Notes Section:  File: [id number]

## 2015-09-02 NOTE — Anesthesia Preprocedure Evaluation (Addendum)
Anesthesia Evaluation  Patient identified by MRN, date of birth, ID band Patient awake    Reviewed: Allergy & Precautions, NPO status , Patient's Chart, lab work & pertinent test results  Airway Mallampati: III  TM Distance: >3 FB Neck ROM: Full    Dental  (+) Teeth Intact   Pulmonary former smoker,    breath sounds clear to auscultation       Cardiovascular negative cardio ROS   Rhythm:Regular Rate:Normal     Neuro/Psych negative neurological ROS  negative psych ROS   GI/Hepatic Neg liver ROS, GERD  ,  Endo/Other  negative endocrine ROS  Renal/GU   negative genitourinary   Musculoskeletal negative musculoskeletal ROS (+)   Abdominal   Peds negative pediatric ROS (+)  Hematology negative hematology ROS (+)   Anesthesia Other Findings   Reproductive/Obstetrics (+) Pregnancy                            Lab Results  Component Value Date   WBC 12.0* 06/16/2015   HGB 11.4* 06/16/2015   HCT 34.4* 06/16/2015   MCV 86.9 06/16/2015   PLT 245 06/16/2015   No results found for: INR, PROTIME  Anesthesia Physical Anesthesia Plan  ASA: III  Anesthesia Plan: Spinal   Post-op Pain Management:    Induction:   Airway Management Planned: Natural Airway  Additional Equipment:   Intra-op Plan:   Post-operative Plan:   Informed Consent: I have reviewed the patients History and Physical, chart, labs and discussed the procedure including the risks, benefits and alternatives for the proposed anesthesia with the patient or authorized representative who has indicated his/her understanding and acceptance.     Plan Discussed with: CRNA  Anesthesia Plan Comments:         Anesthesia Quick Evaluation

## 2015-09-02 NOTE — H&P (Signed)
Obstetric Preoperative History and Physical  [name] is a 35 y.o. [id number] with IUP at [redacted]w[redacted]d presenting for scheduled cesarean section and bilateral tubal sterilization for suspected macrosomia (08/30/13 EFW 4992g), history of shoulder dystocia in previous pregnancy and undesired fertility.  No acute concerns.   Prenatal Course Patient Active Problem List   Diagnosis Date Noted  . History of shoulder dystocia in prior pregnancy, currently pregnant in third trimester 08/31/2015  . Large for gestational age fetus affecting management of mother, antepartum 08/03/2015  . AMA (advanced maternal age) multigravida 35+ 06/01/2015  . Kidney stone complicating pregnancy 05/04/2015  . Supervision of normal pregnancy, antepartum 02/08/2015  . Obesity in pregnancy, antepartum 02/08/2015  . History of recurrent UTIs 10/12/2011   Clinic  [facility] Prenatal Labs  Dating  First trimester u/s and LMP Blood type: O/POS/-- (01/17 0927) O+  Genetic Screen   AFP: Normal    NIPS: Normal female Antibody:NEG (01/17 0927)  Anatomic US WNL Rubella: 3.11 (01/17 0927)  GTT Early: 103              Third trimester:118  RPR: NON REAC (01/17 0927)   Flu vaccine 02/08/15 HBsAg: NEGATIVE (01/17 0927)   TDaP vaccine  06/16/15                                            HIV: NONREACTIVE (01/17 0927)   Baby Food Breast                                             GBS:  NEGATIVE  Contraception BTS Pap:WNL 10/15  Circumcision female   Pediatrician [facility]   Support Person Husband - Christiane Ha     Past Medical History  Diagnosis Date  . Miscarriage     X2  . Kidney stones   . UTI (lower urinary tract infection)     Past Surgical History  Procedure Laterality Date  . Dilation and curettage of uterus      X2  . Leep    . Wisdom tooth extraction      OB History  Gravida Para Term Preterm AB SAB TAB Ectopic Multiple Living  6 2 2  2 2    2     # Outcome Date GA Lbr Len/2nd Weight Sex Delivery  Anes PTL Lv  6 Current           5 Term 11/20/08 [redacted]w[redacted]d  9 lb 7 oz (4.281 kg) M Vag-Spont  N Y     Comments: Induced for macrosomia at 39 weeks  4 Term 04/2006 [redacted]w[redacted]d  9 lb 6 oz (4.252 kg) M Vag-Spont  N Y     Complications: Shoulder Dystocia  3 Gravida           2 SAB           1 SAB               Social History   Social History  . Marital Status: Married    Spouse Name: N/A  . Number of Children: N/A  . Years of Education: N/A   Social History Main Topics  . Smoking status: Former Games developer  . Smokeless tobacco: Never Used  . Alcohol Use: No  Comment: socially  . Drug Use: No  . Sexual Activity:    Partners: Male   Other Topics Concern  . None   Social History Narrative    Family History  Problem Relation Age of Onset  . Parkinsonism Mother   . Hypertension Father     history of hypertension  . Diabetes Maternal Grandfather     diabetes 2    Prescriptions prior to admission  Medication Sig Dispense Refill Last Dose  . acetaminophen (TYLENOL) 500 MG tablet Take 1,000 mg by mouth every 6 (six) hours as needed for moderate pain or headache.   09/01/2015 at Unknown time  . Lavender Oil OIL 1 application by Does not apply route 3 (three) times daily as needed (poison ivy).   09/01/2015 at Unknown time  . oxyCODONE-acetaminophen (PERCOCET/ROXICET) 5-325 MG tablet Take 1-2 tablets by mouth every 6 (six) hours as needed. (Patient taking differently: Take 1-2 tablets by mouth every 6 (six) hours as needed for severe pain. ) 30 tablet 0 08/20/2015 at Unknown time  . folic acid (FOLVITE) 400 MCG tablet Take 400 mcg by mouth daily.   08/31/2015  . omeprazole (PRILOSEC) 20 MG capsule Take 1 capsule (20 mg total) by mouth daily. 30 capsule 3 08/31/2015  . tamsulosin (FLOMAX) 0.4 MG CAPS capsule Take 0.4 mg by mouth. Reported on 08/17/2015. Pt only uses when she has a kidney stone.   maintenance    No Known Allergies  Review of Systems: Negative except for what is mentioned in  HPI.  Physical Exam: BP 125/85 mmHg  Pulse 85  Temp(Src) 97.5 F (36.4 C) (Oral)  Resp 18  SpO2 100%  LMP 12/03/2014 FHR by Doppler: 145 bpm CONSTITUTIONAL: Well-developed, well-nourished female in no acute distress.  HENT:  Normocephalic, atraumatic, External right and left ear normal. Oropharynx is clear and moist EYES: Conjunctivae and EOM are normal. Pupils are equal, round, and reactive to light. No scleral icterus.  NECK: Normal range of motion, supple, no masses SKIN: Skin is warm and dry. No rash noted. Not diaphoretic. No erythema. No pallor. NEUROLGIC: Alert and oriented to person, place, and time. Normal reflexes, muscle tone coordination. No cranial nerve deficit noted. PSYCHIATRIC: Normal mood and affect. Normal behavior. Normal judgment and thought content. CARDIOVASCULAR: Normal heart rate noted, regular rhythm RESPIRATORY: Effort and breath sounds normal, no problems with respiration noted ABDOMEN: Soft, nontender, nondistended, gravid.  PELVIC: Deferred MUSCULOSKELETAL: Normal range of motion. No edema and no tenderness. 2+ distal pulses.   Pertinent Labs/Studies:   Results for orders placed or performed during the hospital encounter of 09/02/15 (from the past 72 hour(s))  CBC     Status: Abnormal   Collection Time: 09/02/15  9:20 AM  Result Value Ref Range   WBC 12.9 (H) 4.0 - 10.5 K/uL   RBC 4.33 3.87 - 5.11 MIL/uL   Hemoglobin 11.5 (L) 12.0 - 15.0 g/dL   HCT 16.1 (L) 09.6 - 04.5 %   MCV 79.7 78.0 - 100.0 fL   MCH 26.6 26.0 - 34.0 pg   MCHC 33.3 30.0 - 36.0 g/dL   RDW 40.9 81.1 - 91.4 %   Platelets 292 150 - 400 K/uL    Ultrasound 08/31/2015 [redacted]w[redacted]d EFW 4992g (11-0) >99th%ile; AC 97 %ile, AFI 16.4 cm, cephalic  Assessment and Plan: [name] is a 35 y.o. [id number] at [redacted]w[redacted]d being admitted for scheduled cesarean section for concern about fetal macrosomia and history of shoulder dystocia in previous pregnancy.  Patient was  originally scheduled for induction  of labor, but changed her mind about mode of delivery given increased concern about possible shoulder dystocia and subsequent consequences. She was counseled that the ultrasound EFW does have a known margin of error; the infant may end up being equal to or greater than a pound heavier or lighter in actual weight.  She still wants to proceed with this surgery.  She also desires a bilateral tubal sterilization for undesired fertility.   The risks of cesarean section were discussed with the patient including but were not limited to: bleeding which may require transfusion or reoperation; infection which may require antibiotics; injury to bowel, bladder, ureters or other surrounding organs; injury to the fetus; need for additional procedures including hysterectomy in the event of a life-threatening hemorrhage; placental abnormalities wth subsequent pregnancies, incisional problems, thromboembolic phenomenon and other postoperative/anesthesia complications.  Patient also desires permanent sterilization.  Other reversible forms of contraception were discussed with patient; she declines all other modalities. Risks of procedure discussed with patient including but not limited to: risk of regret, permanence of method, bleeding, infection, injury to surrounding organs and need for additional procedures.  Failure risk of 1-2% with increased risk of ectopic gestation if pregnancy occurs was also discussed with patient.  The patient concurred with the proposed plan, giving informed written consent for the procedures.  Patient has been NPO since last night she will remain NPO for procedure. Anesthesia and OR aware. Preoperative prophylactic antibiotics and SCDs ordered on call to the OR. To OR when ready.     Jaynie Collins, MD, FACOG  Attending Obstetrician & Gynecologist  Faculty Practice, [facility]

## 2015-09-02 NOTE — Addendum Note (Signed)
Addendum  created 09/02/15 1448 by Shelton Silvas, MD   Modules edited: Orders, PRL Based Order Sets

## 2015-09-02 NOTE — Op Note (Signed)
[name] PROCEDURE DATE: 09/02/2015  PREOPERATIVE DIAGNOSES: Intrauterine pregnancy at [redacted]w[redacted]d weeks gestation; concern about macrosomia and history of shoulder dystocia with previous pregnancy; undesired fertility  POSTOPERATIVE DIAGNOSES: The same  PROCEDURE: Primary Low Transverse Cesarean Section, Bilateral Tubal Sterilization using Filshie clips  SURGEON:  Dr. Jaynie Collins  ASSISTANT:  Webb Silversmith, RNFA   ANESTHESIOLOGIST: Dr. Shona Simpson  INDICATIONS: [name] is a 35 y.o. [id number] at [redacted]w[redacted]d here for cesarean section and bilateral tubal sterilization secondary to the indications listed under preoperative diagnoses; please see preoperative note for further details.  The risks of surgery were discussed with the patient including but were not limited to: bleeding which may require transfusion or reoperation; infection which may require antibiotics; injury to bowel, bladder, ureters or other surrounding organs; injury to the fetus; need for additional procedures including hysterectomy in the event of a life-threatening hemorrhage; placental abnormalities wth subsequent pregnancies, incisional problems, thromboembolic phenomenon and other postoperative/anesthesia complications.  Patient also desires permanent sterilization.  Other reversible forms of contraception were discussed with patient; she declines all other modalities. Risks of procedure discussed with patient including but not limited to: risk of regret, permanence of method, bleeding, infection, injury to surrounding organs and need for additional procedures.  Failure risk of 1-2% with increased risk of ectopic gestation if pregnancy occurs was also discussed with patient.  The patient concurred with the proposed plan, giving informed written consent for the procedures.    FINDINGS:  Viable female infant in cephalic presentation.  Apgars 8 and 9. In OR unofficial weight was 9 lb 2 oz. Clear amniotic fluid.  Intact  placenta, three vessel cord.  Normal uterus, fallopian tubes and ovaries bilaterally. Fallopian tubes sterilized with Filshie clips bilaterally.  ANESTHESIA: Spinal INTRAVENOUS FLUIDS: 1500 ml ESTIMATED BLOOD LOSS: 850 ml URINE OUTPUT:  400 ml SPECIMENS: Placenta sent to L&D COMPLICATIONS: None immediate  PROCEDURE IN DETAIL:  The patient preoperatively received intravenous antibiotics and had sequential compression devices applied to her lower extremities.   She was then taken to the operating room where spinal anesthesia was administered and was found to be adequate. She was then placed in a dorsal supine position with a leftward tilt, and prepped and draped in a sterile manner.  A foley catheter was placed into her bladder and attached to constant gravity.  After an adequate timeout was performed, a Pfannenstiel skin incision was made with scalpel  and carried through to the underlying layer of fascia. The fascia was incised in the midline, and this incision was extended bilaterally using the Mayo scissors.  Kocher clamps were applied to the superior aspect of the fascial incision and the underlying rectus muscles were dissected off bluntly. A similar process was carried out on the inferior aspect of the fascial incision. The rectus muscles were separated in the midline bluntly and the peritoneum was entered bluntly. Attention was turned to the lower uterine segment where a low transverse hysterotomy was made with a scalpel and extended bilaterally bluntly.  The infant was successfully delivered, the cord was clamped and cut after one minute, and the infant was handed over to awaiting neonatology team. Uterine massage was then administered, and the placenta delivered intact with a three-vessel cord. The uterus was then cleared of clot and debris.  The hysterotomy was closed with 0 Vicryl in a running locked fashion, and an imbricating layer was also placed with 0 Vicryl. Attention was then turned to the  fallopian tubes, and Filshie clips were  placed about 3 cm from the cornua, with care given to incorporate the underlying mesosalpinx on both sides, allowing for bilateral tubal sterilization. The pelvis was cleared of all clot and debris. Hemostasis was confirmed on all surfaces.  The peritoneum was reapproximated using 0 Vicryl running stitches. The fascia was then closed using 0 PDS in a running fashion.  The subcutaneous layer was irrigated, then reapproximated with 2-0 plain gut interrupted stitches, and 30 ml of 0.5% Marcaine was injected subcutaneously around the incision.  The skin was closed with a 4-0 Vicryl subcuticular stitch. The patient tolerated the procedure well. Sponge, lap, instrument and needle counts were correct x 3.  She was taken to the recovery room in stable condition.    Jaynie Collins, MD, FACOG Attending Obstetrician & Gynecologist Faculty Practice, [facility]

## 2015-09-03 LAB — CBC
HEMATOCRIT: 28.9 % — AB (ref 36.0–46.0)
HEMOGLOBIN: 9.5 g/dL — AB (ref 12.0–15.0)
MCH: 26.3 pg (ref 26.0–34.0)
MCHC: 32.9 g/dL (ref 30.0–36.0)
MCV: 80.1 fL (ref 78.0–100.0)
Platelets: 279 10*3/uL (ref 150–400)
RBC: 3.61 MIL/uL — AB (ref 3.87–5.11)
RDW: 13.7 % (ref 11.5–15.5)
WBC: 13.1 10*3/uL — AB (ref 4.0–10.5)

## 2015-09-03 LAB — BIRTH TISSUE RECOVERY COLLECTION (PLACENTA DONATION)

## 2015-09-03 LAB — RPR: RPR: NONREACTIVE

## 2015-09-03 MED ORDER — DIPHENHYDRAMINE HCL 50 MG/ML IJ SOLN: 12.5000 mg | INTRAMUSCULAR | Status: DC | PRN

## 2015-09-03 MED ORDER — KETOROLAC TROMETHAMINE 30 MG/ML IJ SOLN: 30.0000 mg | Freq: Four times a day (QID) | INTRAMUSCULAR | Status: AC | PRN

## 2015-09-03 MED ORDER — NALBUPHINE HCL 10 MG/ML IJ SOLN: 5.0000 mg | Freq: Once | INTRAMUSCULAR | Status: DC | PRN

## 2015-09-03 MED ORDER — NALOXONE HCL 0.4 MG/ML IJ SOLN: 0.4000 mg | INTRAMUSCULAR | Status: DC | PRN

## 2015-09-03 MED ORDER — NALBUPHINE HCL 10 MG/ML IJ SOLN: 5.0000 mg | INTRAMUSCULAR | Status: DC | PRN

## 2015-09-03 MED ORDER — MEPERIDINE HCL 25 MG/ML IJ SOLN: 6.2500 mg | INTRAMUSCULAR | Status: DC | PRN

## 2015-09-03 MED ORDER — ONDANSETRON HCL 4 MG/2ML IJ SOLN: 4.0000 mg | Freq: Three times a day (TID) | INTRAMUSCULAR | Status: DC | PRN

## 2015-09-03 MED ORDER — DEXTROSE 5 % IV SOLN
1.0000 ug/kg/h | INTRAVENOUS | Status: DC | PRN
  Filled 2015-09-03: qty 2

## 2015-09-03 MED ORDER — DIPHENHYDRAMINE HCL 25 MG PO CAPS: 25.0000 mg | ORAL_CAPSULE | ORAL | Status: DC | PRN

## 2015-09-03 MED ORDER — SCOPOLAMINE 1 MG/3DAYS TD PT72
1.0000 | MEDICATED_PATCH | Freq: Once | TRANSDERMAL | Status: DC
  Filled 2015-09-03: qty 1

## 2015-09-03 MED ORDER — ONDANSETRON 8 MG PO TBDP
8.0000 mg | ORAL_TABLET | Freq: Three times a day (TID) | ORAL | Status: DC | PRN
  Administered 2015-09-03 – 2015-09-04 (×2): 8 mg via ORAL
  Filled 2015-09-03 (×3): qty 1

## 2015-09-03 MED ORDER — SODIUM CHLORIDE 0.9% FLUSH: 3.0000 mL | INTRAVENOUS | Status: DC | PRN

## 2015-09-03 NOTE — Lactation Note (Signed)
This note was copied from a baby's chart. Lactation Consultation Note  Patient Name: [name] [id number] Date: 09/03/2015 Reason for consult: Follow-up assessment   With this mom of term baby, now 56 hours old. The baby has had more than adequate wet and dirty diapers, and mom has easily expressed colostrum. Mom was "not sure my baby is getting enough to eat" She was describing cluster feeding, and this time, was being held by a visitor, with a pacifier in her mouth. Mom's last baby is 75 years old. I reviewed with her the reasons to avoid a pacifier, supply and demand, cluster feeding. and reviewed hand expression with her. I also tried to reassure mom that the baby would not have had so many voids and stools, if she had not transferred colostrum from her. She seemed to understand, and I advised her to feed with cues, and that the first couple of days are the most difficult, but it will get better when her milk transitions in.    Maternal Data    Feeding Feeding Type: Breast Fed Length of feed: 15 min  LATCH Score/Interventions    Audible Swallowing:  (easily expressed colostrum, HE reviewed with mom)  Type of Nipple: Everted at rest and after stimulation (semi flat, soft, compressible)              Lactation Tools Discussed/Used     Consult Status Consult Status: Follow-up Date: 09/04/15 Follow-up type: In-patient    Alfred Levins 09/03/2015, 12:56 PM

## 2015-09-03 NOTE — Progress Notes (Signed)
Post Partum Day 1 Subjective:  [name] is a 35 y.o. [id number] [redacted]w[redacted]d s/p pLTCS due to macrosomia and bilateral tubal sterilization yesterday.  No acute events overnight.  Pt denies problems with ambulating, voiding or po intake.  She denies nausea or vomiting.  Pain is well controlled.  She has not had flatus.  Lochia Small.  Plan for birth control is bilateral tubal ligation.  Method of Feeding: Breast - going well.  Objective: Blood pressure 107/64, pulse 67, temperature 98 F (36.7 C), temperature source Oral, resp. rate 18, height  (1.702 m), weight 115.667 kg (255 lb), last menstrual period 12/03/2014, SpO2 99 %, unknown if currently breastfeeding.  Physical Exam:  General: alert, cooperative and no distress Lochia:normal flow Chest: normal WOB Heart: Regular rate Abdomen: +BS, soft, mild TTP (appropriate) Uterine Fundus: firm DVT Evaluation: No evidence of DVT seen on physical exam. Extremities: mild edema   Recent Labs  09/02/15 0920 09/03/15 0604  HGB 11.5* 9.5*  HCT 34.5* 28.9*    Assessment/Plan:  ASSESSMENT: [name] is a 35 y.o. [id number] [redacted]w[redacted]d s/p rLTCS and BTS.  #Pain: Well controlled #FEN/GI: Tolerating PO #postpartum care: -Continue routine PP care -Breastfeeding support PRN - Lactation Consult - Contraception: BTS -Anticipate d/c POD #2   LOS: 1 day   Barnie Mort, Med Student

## 2015-09-03 NOTE — Progress Notes (Signed)
Post-Op Day 1, PLTCS for suspected macrosomina, hx shoulder dystocia  Subjective: No complaints, up ad lib, voiding and tolerating PO, passing flatus,small lochia,.plans to breastfeed, bilateral tubal ligation  Objective: Blood pressure 107/64, pulse 67, temperature 98 F (36.7 C), temperature source Oral, resp. rate 18, height  (1.702 m), weight 115.667 kg (255 lb), last menstrual period 12/03/2014, SpO2 99 %, unknown if currently breastfeeding.  Physical Exam:  General: alert, cooperative and no distress Lochia:normal flow Chest: CTAB Heart: RRR no m/r/g Abdomen: +BS, soft, nontender, dsg dry/intact Uterine Fundus: firm DVT Evaluation: No evidence of DVT seen on physical exam. Extremities: trace edema   Recent Labs  09/02/15 0920 09/03/15 0604  HGB 11.5* 9.5*  HCT 34.5* 28.9*    Assessment/Plan: Lactation consult   LOS: 1 day   Tate,Dawn Ramaswamy 09/03/2015, 7:54 AM

## 2015-09-04 MED ORDER — OXYCODONE-ACETAMINOPHEN 5-325 MG PO TABS: 1.0000 | ORAL_TABLET | ORAL | Status: DC | PRN

## 2015-09-04 MED ORDER — IBUPROFEN 600 MG PO TABS: 600.0000 mg | ORAL_TABLET | Freq: Four times a day (QID) | ORAL | Status: DC

## 2015-09-04 NOTE — Discharge Summary (Signed)
OB Discharge Summary     Patient Name: [name] DOB: Aug 01, 1980 MRN: [id number]  Date of admission: 09/02/2015 Delivering MD: Jaynie Collins A   Date of discharge: 09/04/2015  Admitting diagnosis: cesarean section Intrauterine pregnancy: [redacted]w[redacted]d     Secondary diagnosis:  Principal Problem:   S/P cesarean section Active Problems:   History of recurrent UTIs   Supervision of normal pregnancy, antepartum   Obesity in pregnancy, antepartum   AMA (advanced maternal age) multigravida 35+   Large for gestational age fetus affecting management of mother, antepartum   History of shoulder dystocia in prior pregnancy, currently pregnant in third trimester  Additional problems: none     Discharge diagnosis: Term Pregnancy Delivered                                                                                                Post partum procedures:None  Augmentation: NA  Complications: None  Hospital course:  Sceduled C/S   35 y.o. yo [id number] at [redacted]w[redacted]d was admitted to the hospital 09/02/2015 for scheduled cesarean section with the following indication:Elective Primary.  Membrane Rupture Time/Date: 11:07 AM ,09/02/2015   Patient delivered a Viable infant.09/02/2015  Details of operation can be found in separate operative note.  Pateint had an uncomplicated postpartum course.  She is ambulating, tolerating a regular diet, passing flatus, and urinating well. Patient is discharged home in stable condition on  09/04/2015          Physical exam  Filed Vitals:   09/03/15 0412 09/03/15 0800 09/03/15 1200 09/03/15 1739  BP: 107/64 103/58 118/72 115/73  Pulse: 67 70 86 77  Temp: 98 F (36.7 C) 98.3 F (36.8 C) 98.9 F (37.2 C) 98.1 F (36.7 C)  TempSrc: Oral Oral Oral Oral  Resp: Height:      Weight:      SpO2: 99% 95% 98%    General: alert, cooperative and no distress Lochia: appropriate Uterine Fundus: firm Incision: Healing well with no significant drainage,  No significant erythema, Dressing is clean, dry, and intact DVT Evaluation: No evidence of DVT seen on physical exam. Negative Homan's sign. Labs: Lab Results  Component Value Date   WBC 13.1* 09/03/2015   HGB 9.5* 09/03/2015   HCT 28.9* 09/03/2015   MCV 80.1 09/03/2015   PLT 279 09/03/2015   CMP Latest Ref Rng 01/02/2014  Glucose 70 - 99 mg/dL 85  BUN 6 - 23 mg/dL 14  Creatinine 1.47 - 8.29 mg/dL 5.62  Sodium 130 - 865 mEq/L 135  Potassium 3.5 - 5.3 mEq/L 4.3  Chloride 96 - 112 mEq/L 101  CO2 19 - 32 mEq/L 26  Calcium 8.4 - 10.5 mg/dL 9.5  Total Protein 6.0 - 8.3 g/dL 6.8  Total Bilirubin 0.2 - 1.2 mg/dL 0.9  Alkaline Phos 39 - 117 U/L 52  AST 0 - 37 U/L 15  ALT 0 - 35 U/L 12    Discharge instruction: per After Visit Summary and "Baby and Me Booklet".  After visit meds:    Medication List    TAKE these  medications        acetaminophen 500 MG tablet  Commonly known as:  TYLENOL  Take 1,000 mg by mouth every 6 (six) hours as needed for moderate pain or headache.     folic acid 400 MCG tablet  Commonly known as:  FOLVITE  Take 400 mcg by mouth daily.     ibuprofen 600 MG tablet  Commonly known as:  ADVIL,MOTRIN  Take 1 tablet (600 mg total) by mouth every 6 (six) hours.     Lavender Oil Oil  1 application by Does not apply route 3 (three) times daily as needed (poison ivy).     omeprazole 20 MG capsule  Commonly known as:  PRILOSEC  Take 1 capsule (20 mg total) by mouth daily.     oxyCODONE-acetaminophen 5-325 MG tablet  Commonly known as:  PERCOCET/ROXICET  Take 1-2 tablets by mouth every 6 (six) hours as needed.     oxyCODONE-acetaminophen 5-325 MG tablet  Commonly known as:  PERCOCET/ROXICET  Take 1 tablet by mouth every 4 (four) hours as needed (pain scale 4-7).     tamsulosin 0.4 MG Caps capsule  Commonly known as:  FLOMAX  Take 0.4 mg by mouth. Reported on 08/17/2015. Pt only uses when she has a kidney stone.        Diet: routine  diet  Activity: Advance as tolerated. Pelvic rest for 6 weeks.   Outpatient follow up:6 weeks Follow up Appt:Future Appointments Date Time Provider Department Center  10/13/2015 11:00 AM Allie Bossier, MD [facility]   Follow up Visit:No Follow-up on file.  Postpartum contraception: Tubal Ligation  Newborn Data: Live born female  Birth Weight: 9 lb 2.4 oz (4150 g) APGAR: 8, 9  Baby Feeding: Breast Disposition:home with mother   09/04/2015 Federico Flake, MD

## 2015-09-04 NOTE — Lactation Note (Signed)
This note was copied from a baby's chart. Lactation Consultation Note  Follow up visit made.  NP just visited mom and formula supplementation ordered.  Mom states baby is not satisfied at breast.  Observed baby latch well suck 5-6 times then pull of frustrated.  Baby continues to cue and act hungry.  Mom is massaging breast during feeding.  Mom would like to give baby formula.  She has pumped and hand expressed twice and 5 mls obtained and finger fed to baby.  I demonstrated cup feeding using foley cup.  Baby eagerly took 15 mls of Alimentum.  Instructed to continue feeding at breast with cues, post pump every 2-3 hours and supplement with 15 mls of expressed breast milk/formula.  Awaiting serum bilirubin.  Patient Name: [name] [id number] Date: 09/04/2015 Reason for consult: Follow-up assessment   Maternal Data    Feeding Feeding Type: Breast Fed Length of feed: 10 min  LATCH Score/Interventions Latch: Repeated attempts needed to sustain latch, nipple held in mouth throughout feeding, stimulation needed to elicit sucking reflex.  Audible Swallowing: A few with stimulation Intervention(s): Hand expression;Alternate breast massage  Type of Nipple: Everted at rest and after stimulation  Comfort (Breast/Nipple): Soft / non-tender     Hold (Positioning): No assistance needed to correctly position infant at breast.  LATCH Score: 8  Lactation Tools Discussed/Used     Consult Status      [name] 09/04/2015, 9:20 AM

## 2015-09-08 ENCOUNTER — Encounter: Payer: Self-pay | Admitting: *Deleted

## 2015-09-09 ENCOUNTER — Telehealth: Payer: Self-pay | Admitting: *Deleted

## 2015-09-09 ENCOUNTER — Encounter: Payer: Self-pay | Admitting: Obstetrics & Gynecology

## 2015-09-09 NOTE — Telephone Encounter (Signed)
-----   Message from Jacinda S Battle sent at 09/09/2015  3:35 PM EDT ----- Regarding: Refill Request Contact: [phone] Wants a refill on Percocet 

## 2015-09-09 NOTE — Telephone Encounter (Signed)
-----   Message from Olevia Bowens sent at 09/09/2015  3:35 PM EDT ----- Regarding: Refill Request Contact: [phone] Wants a refill on Percocet

## 2015-09-09 NOTE — Telephone Encounter (Signed)
Called pt about percocet refill. LM for her to rtn call.  Dr Macon Large will not be in office until next week so pt will need to be evaluated in office if OTC Ibuprofen/Tylenol does not help with pain

## 2015-09-10 ENCOUNTER — Other Ambulatory Visit: Payer: Self-pay | Admitting: Obstetrics and Gynecology

## 2015-09-10 DIAGNOSIS — N200 Calculus of kidney: Secondary | ICD-10-CM

## 2015-09-10 DIAGNOSIS — O26833 Pregnancy related renal disease, third trimester: Principal | ICD-10-CM

## 2015-09-10 MED ORDER — OXYCODONE-ACETAMINOPHEN 5-325 MG PO TABS: 1.0000 | ORAL_TABLET | Freq: Four times a day (QID) | ORAL | Status: DC | PRN

## 2015-09-10 NOTE — Progress Notes (Signed)
Patient s/p 6/15 pLTCS and BTL and got #30 5/325 percocet. She is tapering off but only has a few left. ROS negative. #15 written for  Cornelia Copa MD Attending Center for [employer] [facility])

## 2015-09-16 ENCOUNTER — Encounter: Payer: Self-pay | Admitting: Obstetrics & Gynecology

## 2015-10-13 ENCOUNTER — Ambulatory Visit: Payer: Commercial Managed Care - PPO | Admitting: Obstetrics & Gynecology

## 2015-11-02 ENCOUNTER — Encounter: Payer: Self-pay | Admitting: Obstetrics & Gynecology

## 2015-11-02 ENCOUNTER — Ambulatory Visit (INDEPENDENT_AMBULATORY_CARE_PROVIDER_SITE_OTHER): Payer: Commercial Managed Care - PPO | Admitting: Obstetrics & Gynecology

## 2015-11-02 NOTE — Patient Instructions (Signed)

## 2015-11-02 NOTE — Progress Notes (Signed)
Post Partum Exam  [name] is a 35 y.o. [phone] female who presents for a postpartum visit. She is 8 weeks postpartum following a low cervical transverse Cesarean section. I have fully reviewed the prenatal and intrapartum course. The delivery was at 39 gestational weeks.  Anesthesia: spinal. Postpartum course has been unremarkable. Baby's course has been without problems. Baby is feeding by breast. Bleeding no bleeding. Bowel function is normal. Bladder function is normal. Patient is sexually active. Contraception method is tubal ligation. Postpartum depression screening:neg. Pt reports that she was overwhelmed initially but, it is improved since her sons are in school.ha  The following portions of the patient's history were reviewed and updated as appropriate: allergies, current medications, past family history, past medical history, past social history, past surgical history and problem list.  Review of Systems Pertinent items are noted in HPI.   Objective:    BP 116/78 mmHg  Pulse 78  Resp 16  Ht 5\' 5"  (1.651 m)  Wt 211 lb (95.709 kg)  BMI 35.11 kg/m2  Breastfeeding? Yes   Pt in NAD Abd: soft, NT, ND Ext: no edema        Assessment:    8weeks postpartum exam. Pap smear not done at today's visit.   Plan:    1. Contraception: tubal ligation 2. F/u 12/2016 for PAP 3. Follow up in: 1 year or as needed.   [name] L. [name], M.D., Evern CoreFACOG

## 2016-10-18 ENCOUNTER — Ambulatory Visit: Payer: Self-pay | Admitting: Urology

## 2017-09-14 ENCOUNTER — Ambulatory Visit: Payer: Commercial Managed Care - PPO | Admitting: Obstetrics & Gynecology

## 2017-09-25 ENCOUNTER — Ambulatory Visit: Payer: Commercial Managed Care - PPO | Admitting: Obstetrics & Gynecology

## 2017-09-28 NOTE — Progress Notes (Signed)
GYNECOLOGY ANNUAL PREVENTATIVE CARE ENCOUNTER NOTE  Subjective:   [name] is a 37 y.o. [phone] female here for a routine annual gynecologic exam.  Current complaints: none.   Denies abnormal vaginal bleeding, discharge, pelvic pain, problems with intercourse or other gynecologic concerns.    Gynecologic History Patient's last menstrual period was 09/26/2017 (approximate). Contraception: tubal ligation Last Pap:12/31/2013. Results were: normal with negative HPV   Obstetric History OB History  Gravida Para Term Preterm AB Living  6 3 3   2 3   SAB TAB Ectopic Multiple Live Births  2     0 3    # Outcome Date GA Lbr Len/2nd Weight Sex Delivery Anes PTL Lv  6 Term 09/02/15 [redacted]w[redacted]d  9 lb 2.4 oz (4.15 kg) F CS-LTranv Spinal  LIV  5 Term 11/20/08 [redacted]w[redacted]d  9 lb 7 oz (4.281 kg) M Vag-Spont  N LIV     Birth Comments: Induced for macrosomia at 39 weeks  4 Term 04/2006 [redacted]w[redacted]d  9 lb 6 oz (4.252 kg) M Vag-Spont  N LIV     Complications: Shoulder Dystocia  3 Gravida           2 SAB           1 SAB             Past Medical History:  Diagnosis Date  . History of recurrent UTIs 10/12/2011  . Kidney stones     Past Surgical History:  Procedure Laterality Date  . CESAREAN SECTION N/A 09/02/2015   Procedure: CESAREAN SECTION;  Surgeon: Tereso Newcomer, MD;  [facility];  Service: Obstetrics;  Laterality: N/A;  . DILATION AND CURETTAGE OF UTERUS     X2  . LEEP    . TUBAL LIGATION  09/02/2015   Procedure: BILATERAL TUBAL LIGATION;  Surgeon: Tereso Newcomer, MD;  Location: [facility];  Service: Obstetrics;;  . WISDOM TOOTH EXTRACTION      Current Outpatient Medications on File Prior to Visit  Medication Sig Dispense Refill  . acetaminophen (TYLENOL) 500 MG tablet Take 1,000 mg by mouth every 6 (six) hours as needed for moderate pain or headache.    . ibuprofen (ADVIL,MOTRIN) 600 MG tablet Take 1 tablet (600 mg total) by mouth every 6 (six) hours. 60 tablet 0   . Lavender Oil OIL 1 application by Does not apply route 3 (three) times daily as needed (poison ivy).    . Multiple Vitamin (MULTIVITAMIN) tablet Take 1 tablet by mouth daily.    . tamsulosin (FLOMAX) 0.4 MG CAPS capsule Take 0.4 mg by mouth. Reported on 08/17/2015. Pt only uses when she has a kidney stone.     No current facility-administered medications on file prior to visit.     Allergies  Allergen Reactions  . Mustard Seed Other (See Comments)    Gi upset    Social History:  reports that she has quit smoking. She has never used smokeless tobacco. She reports that she does not drink alcohol or use drugs.  Family History  Problem Relation Age of Onset  . Parkinsonism Mother   . Hypertension Father        history of hypertension  . Diabetes Maternal Grandfather        diabetes 2    The following portions of the patient's history were reviewed and updated as appropriate: allergies, current medications, past family history, past medical history, past social history, past surgical history and problem list.  Review of Systems Pertinent items noted in HPI and remainder of comprehensive ROS otherwise negative.   Objective:  BP 117/79 (BP Location: Left Arm, Patient Position: Sitting, Cuff Size: Large)   Pulse (!) 53   Resp 16   Ht 5\' 7"  (1.702 m)   Wt 198 lb 6.4 oz (90 kg)   LMP 09/26/2017 (Approximate)   Breastfeeding? No   BMI 31.07 kg/m  CONSTITUTIONAL: Well-developed, well-nourished female in no acute distress.  HENT:  Normocephalic, atraumatic, External right and left ear normal. Oropharynx is clear and moist EYES: Conjunctivae and EOM are normal. Pupils are equal, round, and reactive to light. No scleral icterus.  NECK: Normal range of motion, supple, no masses.  Normal thyroid.  SKIN: Skin is warm and dry. No rash noted. Not diaphoretic. No erythema. No pallor. MUSCULOSKELETAL: Normal range of motion. No tenderness.  No cyanosis, clubbing, or edema.  2+ distal  pulses. NEUROLOGIC: Alert and oriented to person, place, and time. Normal reflexes, muscle tone coordination. No cranial nerve deficit noted. PSYCHIATRIC: Normal mood and affect. Normal behavior. Normal judgment and thought content. CARDIOVASCULAR: Normal heart rate noted, regular rhythm RESPIRATORY: Clear to auscultation bilaterally. Effort and breath sounds normal, no problems with respiration noted. BREASTS: Symmetric in size. No masses, skin changes, nipple drainage, or lymphadenopathy. ABDOMEN: Soft, normal bowel sounds, no distention noted.  No tenderness, rebound or guarding.  PELVIC: Normal appearing external genitalia; normal appearing vaginal mucosa and cervix.  No abnormal discharge noted.  Pap smear obtained.  Normal uterine size, no other palpable masses, no uterine or adnexal tenderness.   Assessment and Plan:  1. Well woman exam with routine gynecological exam - Cytology - PAP - Lipid panel; Future - Comprehensive metabolic panel; Future - CBC; Future - TSH; Future - Hemoglobin A1c; Future Will follow up results of pap smear and manage accordingly. Preventative healthcare labs ordered; patient will come back in fasting to get these done. Routine preventative health maintenance measures emphasized. Please refer to After Visit Summary for other counseling recommendations.    Jaynie Collins, MD, FACOG Obstetrician & Gynecologist, [facility] [employer], [facility]

## 2017-10-01 ENCOUNTER — Encounter: Payer: Self-pay | Admitting: Obstetrics & Gynecology

## 2017-10-01 ENCOUNTER — Ambulatory Visit (INDEPENDENT_AMBULATORY_CARE_PROVIDER_SITE_OTHER): Payer: Commercial Managed Care - PPO | Admitting: Obstetrics & Gynecology

## 2017-10-01 VITALS — BP 117/79 | HR 53 | Resp 16 | Ht 67.0 in | Wt 198.4 lb

## 2017-10-01 DIAGNOSIS — Z124 Encounter for screening for malignant neoplasm of cervix: Secondary | ICD-10-CM

## 2017-10-01 DIAGNOSIS — Z01419 Encounter for gynecological examination (general) (routine) without abnormal findings: Secondary | ICD-10-CM | POA: Diagnosis not present

## 2017-10-01 DIAGNOSIS — Z1151 Encounter for screening for human papillomavirus (HPV): Secondary | ICD-10-CM

## 2017-10-01 NOTE — Patient Instructions (Signed)
Preventive Care 18-39 Years, Female Preventive care refers to lifestyle choices and visits with your health care provider that can promote health and wellness. What does preventive care include?  A yearly physical exam. This is also called an annual well check.  Dental exams once or twice a year.  Routine eye exams. Ask your health care provider how often you should have your eyes checked.  Personal lifestyle choices, including: ? Daily care of your teeth and gums. ? Regular physical activity. ? Eating a healthy diet. ? Avoiding tobacco and drug use. ? Limiting alcohol use. ? Practicing safe sex. ? Taking vitamin and mineral supplements as recommended by your health care provider. What happens during an annual well check? The services and screenings done by your health care provider during your annual well check will depend on your age, overall health, lifestyle risk factors, and family history of disease. Counseling Your health care provider may ask you questions about your:  Alcohol use.  Tobacco use.  Drug use.  Emotional well-being.  Home and relationship well-being.  Sexual activity.  Eating habits.  Work and work Statistician.  Method of birth control.  Menstrual cycle.  Pregnancy history.  Screening You may have the following tests or measurements:  Height, weight, and BMI.  Diabetes screening. This is done by checking your blood sugar (glucose) after you have not eaten for a while (fasting).  Blood pressure.  Lipid and cholesterol levels. These may be checked every 5 years starting at age 66.  Skin check.  Hepatitis C blood test.  Hepatitis B blood test.  Sexually transmitted disease (STD) testing.  BRCA-related cancer screening. This may be done if you have a family history of breast, ovarian, tubal, or peritoneal cancers.  Pelvic exam and Pap test. This may be done every 3 years starting at age 40. Starting at age 59, this may be done every 5  years if you have a Pap test in combination with an HPV test.  Discuss your test results, treatment options, and if necessary, the need for more tests with your health care provider. Vaccines Your health care provider may recommend certain vaccines, such as:  Influenza vaccine. This is recommended every year.  Tetanus, diphtheria, and acellular pertussis (Tdap, Td) vaccine. You may need a Td booster every 10 years.  Varicella vaccine. You may need this if you have not been vaccinated.  HPV vaccine. If you are 69 or younger, you may need three doses over 6 months.  Measles, mumps, and rubella (MMR) vaccine. You may need at least one dose of MMR. You may also need a second dose.  Pneumococcal 13-valent conjugate (PCV13) vaccine. You may need this if you have certain conditions and were not previously vaccinated.  Pneumococcal polysaccharide (PPSV23) vaccine. You may need one or two doses if you smoke cigarettes or if you have certain conditions.  Meningococcal vaccine. One dose is recommended if you are age 27-21 years and a first-year college student living in a residence hall, or if you have one of several medical conditions. You may also need additional booster doses.  Hepatitis A vaccine. You may need this if you have certain conditions or if you travel or work in places where you may be exposed to hepatitis A.  Hepatitis B vaccine. You may need this if you have certain conditions or if you travel or work in places where you may be exposed to hepatitis B.  Haemophilus influenzae type b (Hib) vaccine. You may need this if  you have certain risk factors.  Talk to your health care provider about which screenings and vaccines you need and how often you need them. This information is not intended to replace advice given to you by your health care provider. Make sure you discuss any questions you have with your health care provider. Document Released: 05/02/2001 Document Revised: 11/24/2015  Document Reviewed: 01/05/2015 Elsevier Interactive Patient Education  [employer].

## 2017-10-03 LAB — CYTOLOGY - PAP
Diagnosis: NEGATIVE
HPV (WINDOPATH): NOT DETECTED

## 2018-04-26 ENCOUNTER — Ambulatory Visit
Admission: RE | Admit: 2018-04-26 | Discharge: 2018-04-26 | Disposition: A | Discharge status: Home / Self Care | Payer: Commercial Managed Care - PPO | Attending: Urology | Admitting: Urology

## 2018-04-26 ENCOUNTER — Ambulatory Visit: Payer: Commercial Managed Care - PPO | Admitting: Urology

## 2018-04-26 ENCOUNTER — Encounter: Payer: Self-pay | Admitting: Urology

## 2018-04-26 ENCOUNTER — Ambulatory Visit
Admission: RE | Admit: 2018-04-26 | Discharge: 2018-04-26 | Disposition: A | Discharge status: Home / Self Care | Payer: Commercial Managed Care - PPO | Source: Ambulatory Visit | Attending: Urology | Admitting: Urology

## 2018-04-26 VITALS — BP 124/78 | HR 69 | Ht 67.0 in | Wt 201.1 lb

## 2018-04-26 DIAGNOSIS — Z87440 Personal history of urinary (tract) infections: Secondary | ICD-10-CM

## 2018-04-26 DIAGNOSIS — N200 Calculus of kidney: Secondary | ICD-10-CM | POA: Diagnosis not present

## 2018-04-26 LAB — MICROSCOPIC EXAMINATION
Bacteria, UA: NONE SEEN
Epithelial Cells (non renal): NONE SEEN /hpf (ref 0–10)
WBC, UA: NONE SEEN /hpf (ref 0–5)

## 2018-04-26 LAB — URINALYSIS, COMPLETE
Bilirubin, UA: NEGATIVE
Glucose, UA: NEGATIVE
Ketones, UA: NEGATIVE
Leukocytes, UA: NEGATIVE
NITRITE UA: NEGATIVE
Protein, UA: NEGATIVE
SPEC GRAV UA: 1.02 (ref 1.005–1.030)
UUROB: 0.2 mg/dL (ref 0.2–1.0)
pH, UA: 5.5 (ref 5.0–7.5)

## 2018-04-26 NOTE — Progress Notes (Signed)
04/26/2018 2:12 PM   [name] April 12, 1980 [id number]  CC: History of kidney stones and recurrent UTIs  HPI: I saw [name] in urology clinic for history of recurrent urinary tract infections and kidney stones.  She is a 38 year old very healthy female who was previously followed by Dr. Achilles Dunk at [facility].  She has passed 3 kidney stone spontaneously, and has never required surgical intervention.  She is also had a history of 1-2 urinary tract infections per year.  When she has a urinary tract infection she reports severe dysuria and pelvic pain.  She reports that since switching to coconut oil for lubrication during intercourse her UTIs have decreased significantly.  She denies any gross hematuria, flank pain, weight loss.  There are no alleviating or aggravating factors.  Severity is mild.  She is primarily interested in discussing stone prevention strategies.  There is no recent imaging to review.   PMH: Past Medical History:  Diagnosis Date  . History of recurrent UTIs 10/12/2011  . Kidney stones     Surgical History: Past Surgical History:  Procedure Laterality Date  . CESAREAN SECTION N/A 09/02/2015   Procedure: CESAREAN SECTION;  Surgeon: Tereso Newcomer, MD;  [facility]: [facility];  Service: Obstetrics;  Laterality: N/A;  . DILATION AND CURETTAGE OF UTERUS     X2  . LEEP    . TUBAL LIGATION  09/02/2015   Procedure: BILATERAL TUBAL LIGATION;  Surgeon: Tereso Newcomer, MD;  [facility]: [facility];  Service: Obstetrics;;  . WISDOM TOOTH EXTRACTION      Allergies:  Allergies  Allergen Reactions  . Mustard Seed Other (See Comments)    Gi upset    Family History: Family History  Problem Relation Age of Onset  . Parkinsonism Mother   . Hypertension Father        history of hypertension  . Diabetes Maternal Grandfather        diabetes 2    Social History:  reports that she has quit smoking. She has never used smokeless tobacco. She reports that she  does not drink alcohol or use drugs.  ROS: Please see flowsheet from today's date for complete review of systems.  Physical Exam: BP 124/78 (BP Location: Left Arm, Patient Position: Sitting, Cuff Size: Normal)   Pulse 69   Ht 5\' 7"  (1.702 m)   Wt 201 lb 1.6 oz (91.2 kg)   LMP 04/12/2018 Comment: tubal ligation  BMI 31.50 kg/m    Constitutional:  Alert and oriented, No acute distress. Cardiovascular: No clubbing, cyanosis, or edema. Respiratory: Normal respiratory effort, no increased work of breathing. GI: Abdomen is soft, nontender, nondistended, no abdominal masses GU: No CVA tenderness Lymph: No cervical or inguinal lymphadenopathy. Skin: No rashes, bruises or suspicious lesions. Neurologic: Grossly intact, no focal deficits, moving all 4 extremities. Psychiatric: Normal mood and affect.  Laboratory Data: None to review  Pertinent Imaging: None  Assessment & Plan:   In summary, the patient is a healthy 38 year old female with a history of recurrent UTIs that have been under control recently, as well as history of 3 spontaneously passed kidney stones.  She is here primarily to establish care and discuss stone prevention options.  We discussed general stone prevention strategies including adequate hydration with goal of producing 2.5 L of urine daily, increasing citric acid intake, increasing calcium intake during high oxalate meals, minimizing animal protein, and decreasing salt intake. Information about dietary recommendations given today.  I offered her metabolic evaluation  with 24-hour urine, and she would like to hold off at this time.  KUB ordered to evaluate stone burden.  We discussed the evaluation and treatment of patients with recurrent UTIs at length.  We specifically discussed the differences between asymptomatic bacteriuria and true urinary tract infection.  We discussed the AUA definition of recurrent UTI of at least 2 culture proven symptomatic acute cystitis  episodes in a 34-month period, or 3 within a 1 year period.  We discussed the importance of culture directed antibiotic treatment, and antibiotic stewardship.  First-line therapy includes nitrofurantoin(5 days), Bactrim(3 days), or fosfomycin(3 g single dose).  Possible etiologies of recurrent infection include periurethral tissue atrophy in postmenopausal woman, constipation, sexual activity, incomplete emptying, anatomic abnormalities, and even genetic predisposition.  Finally, we discussed the role of perineal hygiene, timed voiding, adequate hydration, topical vaginal estrogen, cranberry prophylaxis, and low-dose antibiotic prophylaxis.  KUB to evaluate stone burden, call with results Consider metabolic evaluation if significant stone burden  RTC 1 year with KUB  Sondra Come, MD  [facility] [address] [place], [place] [address] [phone]

## 2018-04-26 NOTE — Patient Instructions (Signed)
Urinary Tract Infection, Adult A urinary tract infection (UTI) is an infection of any part of the urinary tract. The urinary tract includes:  The kidneys.  The ureters.  The bladder.  The urethra. These organs make, store, and get rid of pee (urine) in the body. What are the causes? This is caused by germs (bacteria) in your genital area. These germs grow and cause swelling (inflammation) of your urinary tract. What increases the risk? You are more likely to develop this condition if:  You have a small, thin tube (catheter) to drain pee.  You cannot control when you pee or poop (incontinence).  You are female, and: ? You use these methods to prevent pregnancy: ? A medicine that kills sperm (spermicide). ? A device that blocks sperm (diaphragm). ? You have low levels of a female hormone (estrogen). ? You are pregnant.  You have genes that add to your risk.  You are sexually active.  You take antibiotic medicines.  You have trouble peeing because of: ? A prostate that is bigger than normal, if you are female. ? A blockage in the part of your body that drains pee from the bladder (urethra). ? A kidney stone. ? A nerve condition that affects your bladder (neurogenic bladder). ? Not getting enough to drink. ? Not peeing often enough.  You have other conditions, such as: ? Diabetes. ? A weak disease-fighting system (immune system). ? Sickle cell disease. ? Gout. ? Injury of the spine. What are the signs or symptoms? Symptoms of this condition include:  Needing to pee right away (urgently).  Peeing often.  Peeing small amounts often.  Pain or burning when peeing.  Blood in the pee.  Pee that smells bad or not like normal.  Trouble peeing.  Pee that is cloudy.  Fluid coming from the vagina, if you are female.  Pain in the belly or lower back. Other symptoms include:  Throwing up (vomiting).  No urge to eat.  Feeling mixed up (confused).  Being tired  and grouchy (irritable).  A fever.  Watery poop (diarrhea). How is this treated? This condition may be treated with:  Antibiotic medicine.  Other medicines.  Drinking enough water. Follow these instructions at home:  Medicines  Take over-the-counter and prescription medicines only as told by your doctor.  If you were prescribed an antibiotic medicine, take it as told by your doctor. Do not stop taking it even if you start to feel better. General instructions  Make sure you: ? Pee until your bladder is empty. ? Do not hold pee for a long time. ? Empty your bladder after sex. ? Wipe from front to back after pooping if you are a female. Use each tissue one time when you wipe.  Drink enough fluid to keep your pee pale yellow.  Keep all follow-up visits as told by your doctor. This is important. Contact a doctor if:  You do not get better after 1-2 days.  Your symptoms go away and then come back. Get help right away if:  You have very bad back pain.  You have very bad pain in your lower belly.  You have a fever.  You are sick to your stomach (nauseous).  You are throwing up. Summary  A urinary tract infection (UTI) is an infection of any part of the urinary tract.  This condition is caused by germs in your genital area.  There are many risk factors for a UTI. These include having a small, thin   tube to drain pee and not being able to control when you pee or poop.  Treatment includes antibiotic medicines for germs.  Drink enough fluid to keep your pee pale yellow. This information is not intended to replace advice given to you by your health care provider. Make sure you discuss any questions you have with your health care provider. Document Released: 08/23/2007 Document Revised: 09/13/2017 Document Reviewed: 09/13/2017 Elsevier Interactive Patient Education  2019 Elsevier Inc.  

## 2018-04-29 ENCOUNTER — Telehealth: Payer: Self-pay | Admitting: Family Medicine

## 2018-04-29 NOTE — Telephone Encounter (Signed)
-----   Message from Sondra Come, MD sent at 04/29/2018 12:45 PM EST ----- She has a few very small stones in the left kidney, would not recommend any surgical intervention for these.  Continue prevention strategies as discussed in clinic.  Can perform 24-hour urine testing in the future if patient desires, otherwise follow-up in 1 year as scheduled.  Legrand Rams, MD 04/29/2018

## 2018-04-29 NOTE — Telephone Encounter (Signed)
Patient notified and will do the 24 hour urine. Litho link faxed.

## 2018-04-30 ENCOUNTER — Ambulatory Visit: Payer: Self-pay | Admitting: Urology

## 2018-09-26 DIAGNOSIS — F419 Anxiety disorder, unspecified: Secondary | ICD-10-CM

## 2018-09-26 DIAGNOSIS — F5105 Insomnia due to other mental disorder: Secondary | ICD-10-CM

## 2018-09-27 MED ORDER — HYDROXYZINE HCL 25 MG PO TABS: 25.0000 mg | ORAL_TABLET | Freq: Four times a day (QID) | ORAL | 2 refills | Status: DC | PRN

## 2018-10-02 ENCOUNTER — Encounter: Payer: Self-pay | Admitting: Radiology

## 2018-10-15 ENCOUNTER — Telehealth: Payer: Self-pay | Admitting: Radiology

## 2018-10-15 NOTE — Telephone Encounter (Signed)
Spoke with patient to update mailing address, reminder letter for Annual Exam was returned, patient stated that she would call back to schedule this appointment at a later date. Mailing Address updated.

## 2018-12-04 ENCOUNTER — Ambulatory Visit (INDEPENDENT_AMBULATORY_CARE_PROVIDER_SITE_OTHER): Payer: Commercial Managed Care - PPO | Admitting: Nurse Practitioner

## 2018-12-04 ENCOUNTER — Encounter: Payer: Self-pay | Admitting: Nurse Practitioner

## 2018-12-04 ENCOUNTER — Other Ambulatory Visit: Payer: Self-pay

## 2018-12-04 VITALS — Ht 67.0 in | Wt 195.0 lb

## 2018-12-04 DIAGNOSIS — Z7689 Persons encountering health services in other specified circumstances: Secondary | ICD-10-CM

## 2018-12-04 DIAGNOSIS — F5104 Psychophysiologic insomnia: Secondary | ICD-10-CM | POA: Diagnosis not present

## 2018-12-04 DIAGNOSIS — F419 Anxiety disorder, unspecified: Secondary | ICD-10-CM

## 2018-12-04 MED ORDER — PAROXETINE HCL 10 MG PO TABS: 10.0000 mg | ORAL_TABLET | Freq: Every day | ORAL | 2 refills | Status: DC

## 2018-12-04 NOTE — Patient Instructions (Addendum)
[name],   Thank you for coming in to clinic virtually today. It was very nice to meet you.  I look forward to providing the general medical care you need.    1. For your insomnia: - start practicing sleep hygiene tips. - Improving mental health will help insomnia. - Take melatonin 5-10 mg at bedtime nightly for 14 days. - Avoid alcohol due to middle of the night wakefulness.  2. For anxiety and depression - START paroxetine 10 mg once daily.  Start with 1/2 tab for first 6-8 days to help with reducing side effects.  Take with food if you have nausea.   - Continue hydroxyzine 25 mg tablet.  If needed during the day for overwhelming anxiety / panic / hyperventilation, take 1/2 tablet as needed. May continue to take whole tablet at night.  It is safe to take this with melatonin if needed. - Continue deep breathing,  - Get regular exercise of at least 10 minutes most days.  Up to 30 minutes about 5 days per week is best for improving moods. - Seek support as needed from friends through regular virtual communication.  ** Tips for parents to help with stress of remote learning with their children can be found at: https://www.patterson-winters.biz/   Sleep Hygiene Tips  Take medicines only as directed by your health care provider.  Keep regular sleeping and waking hours. Avoid naps.  Keep a sleep diary to help you and your health care provider figure out what could be causing your insomnia. Include:  When you sleep.  When you wake up during the night.  How well you sleep.  How rested you feel the next day.  Any side effects of medicines you are taking.  What you eat and drink.  Make your bedroom a comfortable place where it is easy to fall asleep:  Put up shades or special blackout curtains to block light from outside.  Use a white noise machine to block noise.  Keep the temperature  cool.  Exercise regularly as directed by your health care provider. Avoid exercising right before bedtime.  Use relaxation techniques to manage stress. Ask your health care provider to suggest some techniques that may work well for you. These may include:  Breathing exercises.  Routines to release muscle tension.  Visualizing peaceful scenes.  Cut back on alcohol, caffeinated beverages, and cigarettes, especially close to bedtime. These can disrupt your sleep.  Do not overeat or eat spicy foods right before bedtime. This can lead to digestive discomfort that can make it hard for you to sleep.  Limit screen use before bedtime. This includes:  Watching TV.  Using your smartphone, tablet, and computer.  Stick to a routine. This can help you fall asleep faster. Try to do a quiet activity, brush your teeth, and go to bed at the same time each night.  Get out of bed if you are still awake after 15 minutes of trying to sleep. Keep the lights down, but try reading or doing a quiet activity. When you feel sleepy, go back to bed.  Make sure that you drive carefully. Avoid driving if you feel very sleepy.  Keep all follow-up appointments as directed by your health care provider. This is important.  Please schedule a follow-up appointment with Wilhelmina Mcardle, AGNP. Return in about 6 weeks (around 01/15/2019) for anxiety.  If you have any other questions or concerns, please feel free to call the clinic or send a message through MyChart. You  may also schedule an earlier appointment if necessary.  You will receive a survey after today's visit either digitally by e-mail or paper by [employer]. Your experiences and feedback matter to [place].  Please respond so we know how we are doing as we provide care for you.   Cassell Smiles, DNP, AGNP-BC Adult Gerontology Nurse Practitioner [place]

## 2018-12-04 NOTE — Progress Notes (Signed)
Subjective:    Patient ID: Dawn LowensteinMeagan F Tatsch, female    DOB: May 24, 1980, 38 y.o.   MRN: [id number]  [name] is a 38 y.o. female presenting on 12/04/2018 for Establish Care (anxiety)  HPI  Patient is being evaluated virtually today due to positive respiratory symptoms by family members in her home.  We are using doxyme virtual visit platform with synchronous audio and video.  Patient is informed of limitations of virtual visit.  She is at her home and is being evaluated by Wilhelmina McardleLauren Ciani Rutten, AGNP at [facility].  Establish Care New Provider Pt last seen by PCP many years ago.  [facility]bt[facility] for [facility], urology care.   Anxiety No prior treatment.  No prior impact on qol, but has general baseline anxiety with seasons of worsening that resolve. Patient seeks care today because she now notes she is not rebounding as in past. Patient notes anxiety worsening over last 1-2 years.  Has had new proper diagnosis of her older son with epilepsy.  He has improved on meds, but now has side effect from med and psychiatrist needed in 2019.  This caused patient increased season of stress.  Notes she had rebounded before Covid pandemic, but then Covid isolation has increased her anxiety Fear of Covid - fairly low.  Worsening again with isolation, some fear and dread.  Over summer not so bad because of isolation.  Virtual school is more ddemanding, "every day is the same fight."   - Has hyperventilation - Self-soothe, deep breathing, reading, meditation, muscle relaxation Now "blowing up at kids" Can't focus or multitask. - Some days she is okay, but others not happy  - Isolation has not been healthy  Insomnia, crying at bedtime Started hydroxyzine by GYN Started melatonin- 5 mg Is not taking nightly, uses 3 nights per week, alternates between hydroxyzine and melatonin  [facility] 12/04/2018  Decreased Interest 1  Down, Depressed, Hopeless 2  PHQ - 2 Score 3   Altered sleeping 3  Tired, decreased energy 1  Change in appetite 2  Feeling bad or failure about yourself  2  Trouble concentrating 1  Moving slowly or fidgety/restless 1  Suicidal thoughts 0  PHQ-9 Score 13  Difficult doing work/chores Very difficult   GAD 7 : Generalized Anxiety Score 12/04/2018  Nervous, Anxious, on Edge 2  Control/stop worrying 1  Worry too much - different things 1  Trouble relaxing 1  Restless 1  Easily annoyed or irritable 3  Afraid - awful might happen 1  Total GAD 7 Score 10  Anxiety Difficulty Very difficult     Past Medical History:  Diagnosis Date  . History of recurrent UTIs 10/12/2011  . Kidney stones    Past Surgical History:  Procedure Laterality Date  . CESAREAN SECTION N/A 09/02/2015   Procedure: CESAREAN SECTION;  Surgeon: Tereso NewcomerUgonna A Anyanwu, MD;  Location: [facility];  Service: Obstetrics;  Laterality: N/A;  . DILATION AND CURETTAGE OF UTERUS     X2  . LEEP    . TUBAL LIGATION  09/02/2015   Procedure: BILATERAL TUBAL LIGATION;  Surgeon: Tereso NewcomerUgonna A Anyanwu, MD;  Location: [facility];  Service: Obstetrics;;  . WISDOM TOOTH EXTRACTION     Social History   Socioeconomic History  . Marital status: Married    Spouse name: Not on file  . Number of children: Not on file  . Years of education: Not on file  . Highest education  level: Not on file  Occupational History  . Not on file  Social Needs  . Financial resource strain: Not on file  . Food insecurity    Worry: Not on file    Inability: Not on file  . Transportation needs    Medical: Not on file    Non-medical: Not on file  Tobacco Use  . Smoking status: Former Smoker    Years: 0.25  . Smokeless tobacco: Never Used  . Tobacco comment: Quit about 20 years ago  Substance and Sexual Activity  . Alcohol use: Yes    Comment: 6-10 per week over 2-3 days per week  . Drug use: No  . Sexual activity: Yes    Partners: Male    Birth control/protection: Surgical   Lifestyle  . Physical activity    Days per week: Not on file    Minutes per session: Not on file  . Stress: Not on file  Relationships  . Social Herbalist on phone: Not on file    Gets together: Not on file    Attends religious service: Not on file    Active member of club or organization: Not on file    Attends meetings of clubs or organizations: Not on file    Relationship status: Not on file  . Intimate partner violence    Fear of current or ex partner: Not on file    Emotionally abused: Not on file    Physically abused: Not on file    Forced sexual activity: Not on file  Other Topics Concern  . Not on file  Social History Narrative  . Not on file   Family History  Problem Relation Age of Onset  . Parkinsonism Mother   . Hypertension Father        history of hypertension  . Prostate cancer Father   . Hyperlipidemia Father   . Diabetes Maternal Grandfather        diabetes 2  . Healthy Brother   . Healthy Brother    Current Outpatient Medications on File Prior to Visit  Medication Sig  . hydrOXYzine (ATARAX/VISTARIL) 25 MG tablet Take 1-2 tablets (25-50 mg total) by mouth every 6 (six) hours as needed for anxiety (insomnia).  Marland Kitchen ibuprofen (ADVIL,MOTRIN) 600 MG tablet Take 1 tablet (600 mg total) by mouth every 6 (six) hours.  . Multiple Vitamin (MULTIVITAMIN) tablet Take 1 tablet by mouth daily.  Laurina Bustle Oil OIL 1 application by Does not apply route 3 (three) times daily as needed (poison ivy).   No current facility-administered medications on file prior to visit.     Review of Systems  Constitutional: Negative for chills and fever.  HENT: Negative for congestion and sore throat.   Eyes: Negative for pain.  Respiratory: Negative for cough, shortness of breath and wheezing.   Cardiovascular: Negative for chest pain, palpitations and leg swelling.  Gastrointestinal: Negative for abdominal pain, blood in stool, constipation, diarrhea, nausea and  vomiting.  Endocrine: Negative for polydipsia.  Genitourinary: Negative for dysuria, frequency, hematuria and urgency.  Musculoskeletal: Negative for back pain, myalgias and neck pain.  Skin: Negative.  Negative for rash.  Allergic/Immunologic: Negative for environmental allergies.  Neurological: Negative for dizziness, weakness and headaches.  Hematological: Does not bruise/bleed easily.  Psychiatric/Behavioral: Positive for sleep disturbance. Negative for dysphoric mood and suicidal ideas. The patient is nervous/anxious.    Per HPI unless specifically indicated above     Objective:    Ht 5'  7" (1.702 m)   Wt 195 lb (88.5 kg)   BMI 30.54 kg/m   Wt Readings from Last 3 Encounters:  12/04/18 195 lb (88.5 kg)  04/26/18 201 lb 1.6 oz (91.2 kg)  10/01/17 198 lb 6.4 oz (90 kg)    Physical Exam  Patient remotely examined.  Video indicates patient is overweight, normal appearing, well groomed.  Home environment appears clean, well-kept.  Patient is making appropriate eye contact, verbal and non-verbal communication is appropriate.  Normal judgment.  Results for orders placed or performed in visit on 04/26/18  Microscopic Examination   URINE  Result Value Ref Range   WBC, UA None seen 0 - 5 /hpf   RBC, UA 3-10 (A) 0 - 2 /hpf   Epithelial Cells (non renal) None seen 0 - 10 /hpf   Bacteria, UA None seen None seen/Few  Urinalysis, Complete  Result Value Ref Range   Specific Gravity, UA 1.020 1.005 - 1.030   pH, UA 5.5 5.0 - 7.5   Color, UA Yellow Yellow   Appearance Ur Clear Clear   Leukocytes, UA Negative Negative   Protein, UA Negative Negative/Trace   Glucose, UA Negative Negative   Ketones, UA Negative Negative   RBC, UA 1+ (A) Negative   Bilirubin, UA Negative Negative   Urobilinogen, Ur 0.2 0.2 - 1.0 mg/dL   Nitrite, UA Negative Negative   Microscopic Examination See below:       Assessment & Plan:   Problem List Items Addressed This Visit    None    Visit  Diagnoses    Anxiety    -  Primary   Relevant Medications   PARoxetine (PAXIL) 10 MG tablet   Encounter to establish care          Previous PCP was many years ago.  Records will not be requested.  Past medical, family, and surgical history reviewed w/ pt.  Recent specialist visits are reviewed in [facility].    Anxiety Psychophysiologic insomnia Currently uncontrolled with inadequate coping skills.  Increased life stressors are contributing, including changes due to Covid pandemic. PHQ and GAD7 positive.  Concurrent depression cannot be excluded.  Plan: 1. START paroxetine 2. START coping skills with deep breathing, grounding/body scan exercises, tips for remote learning 3. Continue hydroxyzine prn panic, sleep 4. Improve sleep hygiene 5. May take 5-10 mg melatonin about 1 hr prior to bed for 14 days. 6. Reduce alcohol intake. 7. Follow-up 6 weeks.  Meds ordered this encounter  Medications  . PARoxetine (PAXIL) 10 MG tablet    Sig: Take 1 tablet (10 mg total) by mouth daily.    Dispense:  30 tablet    Refill:  2    Order Specific Question:   Supervising Provider    Answer:   Smitty Cords [[id number]]    Follow up plan: Return in about 6 weeks (around 01/15/2019) for anxiety.  - Time spent in direct consultation with patient on Doxy.me: 30 minutes  Wilhelmina Mcardle, DNP, AGPCNP-BC Adult Gerontology Primary Care Nurse Practitioner [facility] 12/11/2018, 11:55 AM

## 2018-12-11 DIAGNOSIS — F5104 Psychophysiologic insomnia: Secondary | ICD-10-CM | POA: Insufficient documentation

## 2018-12-11 DIAGNOSIS — F419 Anxiety disorder, unspecified: Secondary | ICD-10-CM | POA: Insufficient documentation

## 2018-12-23 ENCOUNTER — Other Ambulatory Visit: Payer: Self-pay

## 2018-12-23 DIAGNOSIS — Z20822 Contact with and (suspected) exposure to covid-19: Secondary | ICD-10-CM

## 2018-12-23 NOTE — Progress Notes (Unsigned)
l °

## 2018-12-25 LAB — NOVEL CORONAVIRUS, NAA: SARS-CoV-2, NAA: NOT DETECTED

## 2019-01-09 ENCOUNTER — Ambulatory Visit (INDEPENDENT_AMBULATORY_CARE_PROVIDER_SITE_OTHER): Payer: Commercial Managed Care - PPO | Admitting: Nurse Practitioner

## 2019-01-09 ENCOUNTER — Other Ambulatory Visit: Payer: Self-pay

## 2019-01-09 ENCOUNTER — Encounter: Payer: Self-pay | Admitting: Nurse Practitioner

## 2019-01-09 DIAGNOSIS — F5104 Psychophysiologic insomnia: Secondary | ICD-10-CM

## 2019-01-09 DIAGNOSIS — F419 Anxiety disorder, unspecified: Secondary | ICD-10-CM

## 2019-01-09 NOTE — Progress Notes (Signed)
Telemedicine Encounter: Disclosed to patient at start of encounter that we will provide appropriate telemedicine services.  Patient consents to be treated via phone prior to discussion. - Patient is at her home and is accessed via telephone. - Services are provided by Wilhelmina Mcardle from [facility]. - Limitations of remote visits are discussed.  Subjective:    Patient ID: [name], female    DOB: 1980-08-13, 38 y.o.   MRN: [id number]  [name] is a 38 y.o. female presenting on 01/09/2019 for Anxiety (6 weeks follow up )  HPI Anxiety Paroxetine 10 mg is helping significantly. Tolerating well without side effects. Short fuse is much better Less overwhelming timeline for Covid - less fear about this, less paranoia that was experenced before.  Less hopeless. - Has good motivation. - Sleeping better, no headache  - no weight gain  GAD 7 : Generalized Anxiety Score 01/09/2019 12/04/2018  Nervous, Anxious, on Edge 1 2  Control/stop worrying 0 1  Worry too much - different things 0 1  Trouble relaxing 1 1  Restless 0 1  Easily annoyed or irritable 1 3  Afraid - awful might happen 0 1  Total GAD 7 Score 3 10  Anxiety Difficulty Not difficult at all Very difficult   Depression screen [facility] 2/9 01/09/2019 12/04/2018  Decreased Interest 1 1  Down, Depressed, Hopeless 0 2  PHQ - 2 Score 1 3  Altered sleeping 1 3  Tired, decreased energy 1 1  Change in appetite 1 2  Feeling bad or failure about yourself  1 2  Trouble concentrating 0 1  Moving slowly or fidgety/restless 0 1  Suicidal thoughts 0 0  PHQ-9 Score 5 13  Difficult doing work/chores Not difficult at all Very difficult   Social History   Tobacco Use  . Smoking status: Former Smoker    Years: 0.25  . Smokeless tobacco: Never Used  . Tobacco comment: Quit about 20 years ago  Substance Use Topics  . Alcohol use: Yes    Comment: 6-10 per week over 2-3 days per week  . Drug use: No    Review  of Systems Per HPI unless specifically indicated above     Objective:    There were no vitals taken for this visit.  Wt Readings from Last 3 Encounters:  12/04/18 195 lb (88.5 kg)  04/26/18 201 lb 1.6 oz (91.2 kg)  10/01/17 198 lb 6.4 oz (90 kg)    Physical Exam Patient remotely monitored.  Verbal communication appropriate.  Cognition normal.   Results for orders placed or performed in visit on 12/23/18  Novel Coronavirus, NAA ([facility])   Specimen: Nasopharyngeal(NP) swabs in vial transport medium   NASOPHARYNGE  TESTING  Result Value Ref Range   SARS-CoV-2, NAA Not Detected Not Detected      Assessment & Plan:   Problem List Items Addressed This Visit      Other   Anxiety - Primary   Psychophysiologic insomnia    Improved symptoms with improved sleep.  Patient has had no side effects from medications and tolerates well.  - Continue meds without changes - Continue non-pharm management strategies. - Follow-up 3 months, consider dose reduction in spring or sooner if needed.   - Time spent in direct consultation with patient via telemedicine about above concerns: 6 minutes  Follow up plan: Return in about 3 months (around 04/11/2019) for anxiety, consider reducing medication in spring if able.Wilhelmina Mcardle, DNP, AGPCNP-BC Adult  Gerontology Primary Care Nurse Practitioner [facility] 01/09/2019, 10:14 AM

## 2019-01-15 ENCOUNTER — Ambulatory Visit: Payer: Commercial Managed Care - PPO | Admitting: Nurse Practitioner

## 2019-03-06 ENCOUNTER — Other Ambulatory Visit: Payer: Self-pay | Admitting: Nurse Practitioner

## 2019-03-06 DIAGNOSIS — F419 Anxiety disorder, unspecified: Secondary | ICD-10-CM

## 2019-04-11 ENCOUNTER — Other Ambulatory Visit: Payer: Self-pay | Admitting: Radiology

## 2019-04-11 DIAGNOSIS — N200 Calculus of kidney: Secondary | ICD-10-CM

## 2019-04-24 ENCOUNTER — Ambulatory Visit: Payer: Commercial Managed Care - PPO | Admitting: Urology

## 2019-05-01 ENCOUNTER — Ambulatory Visit: Payer: Commercial Managed Care - PPO | Admitting: Urology

## 2019-05-15 ENCOUNTER — Other Ambulatory Visit: Payer: Self-pay

## 2019-05-15 ENCOUNTER — Ambulatory Visit: Payer: Commercial Managed Care - PPO | Admitting: Urology

## 2019-05-15 ENCOUNTER — Encounter: Payer: Self-pay | Admitting: Urology

## 2019-05-15 ENCOUNTER — Ambulatory Visit
Admission: RE | Admit: 2019-05-15 | Discharge: 2019-05-15 | Disposition: A | Discharge status: Home / Self Care | Payer: Commercial Managed Care - PPO | Source: Ambulatory Visit | Attending: Urology | Admitting: Urology

## 2019-05-15 VITALS — BP 114/78 | HR 64 | Ht 67.0 in | Wt 195.0 lb

## 2019-05-15 DIAGNOSIS — N390 Urinary tract infection, site not specified: Secondary | ICD-10-CM | POA: Diagnosis not present

## 2019-05-15 DIAGNOSIS — N200 Calculus of kidney: Secondary | ICD-10-CM

## 2019-05-15 NOTE — Progress Notes (Signed)
   05/15/2019 10:03 AM   Dorreen Candie Echevaria 1980/07/23 [id number]  Reason for visit: Follow up nephrolithiasis, recurrent UTIs  HPI: I saw Ms. Flight in urology clinic today for follow-up of nephrolithiasis and recurrent UTIs.  She is a healthy 39 year old female who I originally saw in February 2020 for the above issues.  She has passed 3 kidney stone spontaneously in the past and never required surgical intervention.  She also previously was having 1-2 UTIs per year with severe dysuria and pelvic pain, however since switching to coconut oil for lubrication during intercourse her UTIs have almost completely resolved.  At our last visit, we had discussed stone and UTI prevention strategies.  A KUB last year showed scattered left-sided punctate stones.  She deferred further evaluation with 24-hour urine at that time.  She denies any UTIs or stone episodes over the last year and is very pleased with how she is doing.  Her KUB today shows some punctate left-sided stones, less than last year.  We discussed general stone prevention strategies including adequate hydration with goal of producing 2.5 L of urine daily, increasing citric acid intake, increasing calcium intake during high oxalate meals, minimizing animal protein, and decreasing salt intake. Information about dietary recommendations given today.   We discussed the evaluation and treatment of patients with recurrent UTIs at length.  We specifically discussed the differences between asymptomatic bacteriuria and true urinary tract infection.  We discussed the AUA definition of recurrent UTI of at least 2 culture proven symptomatic acute cystitis episodes in a 15-month period, or 3 within a 1 year period.  We discussed the importance of culture directed antibiotic treatment, and antibiotic stewardship.  First-line therapy includes nitrofurantoin(5 days), Bactrim(3 days), or fosfomycin(3 g single dose).  Possible etiologies of recurrent infection  include periurethral tissue atrophy in postmenopausal woman, constipation, sexual activity, incomplete emptying, anatomic abnormalities, and even genetic predisposition.  Finally, we discussed the role of perineal hygiene, timed voiding, adequate hydration, topical vaginal estrogen, cranberry prophylaxis, and low-dose antibiotic prophylaxis.  She would like to follow-up on an as-needed basis Return precautions discussed at length  I spent 20 total minutes on the day of the encounter including pre-visit review of the medical record, face-to-face time with the patient, and post visit ordering of labs/imaging/tests.  Sondra Come, MD  [facility] [address] [place], [place] [address] [phone]

## 2019-05-15 NOTE — Patient Instructions (Signed)
Dietary Guidelines to Help Prevent Kidney Stones Kidney stones are deposits of minerals and salts that form inside your kidneys. Your risk of developing kidney stones may be greater depending on your diet, your lifestyle, the medicines you take, and whether you have certain medical conditions. Most people can reduce their chances of developing kidney stones by following the instructions below. Depending on your overall health and the type of kidney stones you tend to develop, your dietitian may give you more specific instructions. What are tips for following this plan? Reading food labels  Choose foods with "no salt added" or "low-salt" labels. Limit your sodium intake to less than 1500 mg per day.  Choose foods with calcium for each meal and snack. Try to eat about 300 mg of calcium at each meal. Foods that contain 200-500 mg of calcium per serving include: ? 8 oz (237 ml) of milk, fortified nondairy milk, and fortified fruit juice. ? 8 oz (237 ml) of kefir, yogurt, and soy yogurt. ? 4 oz (118 ml) of tofu. ? 1 oz of cheese. ? 1 cup (300 g) of dried figs. ? 1 cup (91 g) of cooked broccoli. ? 1-3 oz can of sardines or mackerel.  Most people need 1000 to 1500 mg of calcium each day. Talk to your dietitian about how much calcium is recommended for you. Shopping  Buy plenty of fresh fruits and vegetables. Most people do not need to avoid fruits and vegetables, even if they contain nutrients that may contribute to kidney stones.  When shopping for convenience foods, choose: ? Whole pieces of fruit. ? Premade salads with dressing on the side. ? Low-fat fruit and yogurt smoothies.  Avoid buying frozen meals or prepared deli foods.  Look for foods with live cultures, such as yogurt and kefir. Cooking  Do not add salt to food when cooking. Place a salt shaker on the table and allow each person to add his or her own salt to taste.  Use vegetable protein, such as beans, textured vegetable  protein (TVP), or tofu instead of meat in pasta, casseroles, and soups. Meal planning   Eat less salt, if told by your dietitian. To do this: ? Avoid eating processed or premade food. ? Avoid eating fast food.  Eat less animal protein, including cheese, meat, poultry, or fish, if told by your dietitian. To do this: ? Limit the number of times you have meat, poultry, fish, or cheese each week. Eat a diet free of meat at least 2 days a week. ? Eat only one serving each day of meat, poultry, fish, or seafood. ? When you prepare animal protein, cut pieces into small portion sizes. For most meat and fish, one serving is about the size of one deck of cards.  Eat at least 5 servings of fresh fruits and vegetables each day. To do this: ? Keep fruits and vegetables on hand for snacks. ? Eat 1 piece of fruit or a handful of berries with breakfast. ? Have a salad and fruit at lunch. ? Have two kinds of vegetables at dinner.  Limit foods that are high in a substance called oxalate. These include: ? Spinach. ? Rhubarb. ? Beets. ? Potato chips and french fries. ? Nuts.  If you regularly take a diuretic medicine, make sure to eat at least 1-2 fruits or vegetables high in potassium each day. These include: ? Avocado. ? Banana. ? Orange, prune, carrot, or tomato juice. ? Baked potato. ? Cabbage. ? Beans and split   peas. General instructions   Drink enough fluid to keep your urine clear or pale yellow. This is the most important thing you can do.  Talk to your health care provider and dietitian about taking daily supplements. Depending on your health and the cause of your kidney stones, you may be advised: ? Not to take supplements with vitamin C. ? To take a calcium supplement. ? To take a daily probiotic supplement. ? To take other supplements such as magnesium, fish oil, or vitamin B6.  Take all medicines and supplements as told by your health care provider.  Limit alcohol intake to no  more than 1 drink a day for nonpregnant women and 2 drinks a day for men. One drink equals 12 oz of beer, 5 oz of wine, or 1 oz of hard liquor.  Lose weight if told by your health care provider. Work with your dietitian to find strategies and an eating plan that works best for you. What foods are not recommended? Limit your intake of the following foods, or as told by your dietitian. Talk to your dietitian about specific foods you should avoid based on the type of kidney stones and your overall health. Grains Breads. Bagels. Rolls. Baked goods. Salted crackers. Cereal. Pasta. Vegetables Spinach. Rhubarb. Beets. Canned vegetables. Pickles. Olives. Meats and other protein foods Nuts. Nut butters. Large portions of meat, poultry, or fish. Salted or cured meats. Deli meats. Hot dogs. Sausages. Dairy Cheese. Beverages Regular soft drinks. Regular vegetable juice. Seasonings and other foods Seasoning blends with salt. Salad dressings. Canned soups. Soy sauce. Ketchup. Barbecue sauce. Canned pasta sauce. Casseroles. Pizza. Lasagna. Frozen meals. Potato chips. French fries. Summary  You can reduce your risk of kidney stones by making changes to your diet.  The most important thing you can do is drink enough fluid. You should drink enough fluid to keep your urine clear or pale yellow.  Ask your health care provider or dietitian how much protein from animal sources you should eat each day, and also how much salt and calcium you should have each day. This information is not intended to replace advice given to you by your health care provider. Make sure you discuss any questions you have with your health care provider. Document Revised: 06/26/2018 Document Reviewed: 02/15/2016 Elsevier Patient Education  2020 [employer]   Urinary Tract Infection, Adult  A urinary tract infection (UTI) is an infection of any part of the urinary tract. The urinary tract includes the kidneys, ureters, bladder,  and urethra. These organs make, store, and get rid of urine in the body. Your health care provider may use other names to describe the infection. An upper UTI affects the ureters and kidneys (pyelonephritis). A lower UTI affects the bladder (cystitis) and urethra (urethritis). What are the causes? Most urinary tract infections are caused by bacteria in your genital area, around the entrance to your urinary tract (urethra). These bacteria grow and cause inflammation of your urinary tract. What increases the risk? You are more likely to develop this condition if:  You have a urinary catheter that stays in place (indwelling).  You are not able to control when you urinate or have a bowel movement (you have incontinence).  You are female and you: ? Use a spermicide or diaphragm for birth control. ? Have low estrogen levels. ? Are pregnant.  You have certain genes that increase your risk (genetics).  You are sexually active.  You take antibiotic medicines.  You have a condition that   that causes your flow of urine to slow down, such as: ? An enlarged prostate, if you are female. ? Blockage in your urethra (stricture). ? A kidney stone. ? A nerve condition that affects your bladder control (neurogenic bladder). ? Not getting enough to drink, or not urinating often.  You have certain medical conditions, such as: ? Diabetes. ? A weak disease-fighting system (immunesystem). ? Sickle cell disease. ? Gout. ? Spinal cord injury. What are the signs or symptoms? Symptoms of this condition include:  Needing to urinate right away (urgently).  Frequent urination or passing small amounts of urine frequently.  Pain or burning with urination.  Blood in the urine.  Urine that smells bad or unusual.  Trouble urinating.  Cloudy urine.  Vaginal discharge, if you are female.  Pain in the abdomen or the lower back. You may also have:  Vomiting or a decreased  appetite.  Confusion.  Irritability or tiredness.  A fever.  Diarrhea. The first symptom in older adults may be confusion. In some cases, they may not have any symptoms until the infection has worsened. How is this diagnosed? This condition is diagnosed based on your medical history and a physical exam. You may also have other tests, including:  Urine tests.  Blood tests.  Tests for sexually transmitted infections (STIs). If you have had more than one UTI, a cystoscopy or imaging studies may be done to determine the cause of the infections. How is this treated? Treatment for this condition includes:  Antibiotic medicine.  Over-the-counter medicines to treat discomfort.  Drinking enough water to stay hydrated. If you have frequent infections or have other conditions such as a kidney stone, you may need to see a health care provider who specializes in the urinary tract (urologist). In rare cases, urinary tract infections can cause sepsis. Sepsis is a life-threatening condition that occurs when the body responds to an infection. Sepsis is treated in the hospital with IV antibiotics, fluids, and other medicines. Follow these instructions at home:  Medicines  Take over-the-counter and prescription medicines only as told by your health care provider.  If you were prescribed an antibiotic medicine, take it as told by your health care provider. Do not stop using the antibiotic even if you start to feel better. General instructions  Make sure you: ? Empty your bladder often and completely. Do not hold urine for long periods of time. ? Empty your bladder after sex. ? Wipe from front to back after a bowel movement if you are female. Use each tissue one time when you wipe.  Drink enough fluid to keep your urine pale yellow.  Keep all follow-up visits as told by your health care provider. This is important. Contact a health care provider if:  Your symptoms do not get better after  1-2 days.  Your symptoms go away and then return. Get help right away if you have:  Severe pain in your back or your lower abdomen.  A fever.  Nausea or vomiting. Summary  A urinary tract infection (UTI) is an infection of any part of the urinary tract, which includes the kidneys, ureters, bladder, and urethra.  Most urinary tract infections are caused by bacteria in your genital area, around the entrance to your urinary tract (urethra).  Treatment for this condition often includes antibiotic medicines.  If you were prescribed an antibiotic medicine, take it as told by your health care provider. Do not stop using the antibiotic even if you start to feel better.  Keep all follow-up visits as told by your health care provider. This is important. This information is not intended to replace advice given to you by your health care provider. Make sure you discuss any questions you have with your health care provider. Document Revised: 02/21/2018 Document Reviewed: 09/13/2017 Elsevier Patient Education  2020 [employer].

## 2019-06-15 ENCOUNTER — Other Ambulatory Visit: Payer: Self-pay | Admitting: Family Medicine

## 2019-06-15 DIAGNOSIS — F419 Anxiety disorder, unspecified: Secondary | ICD-10-CM

## 2019-08-19 ENCOUNTER — Ambulatory Visit: Payer: Self-pay

## 2019-08-19 NOTE — Telephone Encounter (Signed)
Pt. Called to report a tick bite.  Reported that the tick was found on her abdomen.  Thinks it was there about 24 hrs; stated was trimming trees about 10:00 AM yesterday.  Reported she removed the body with tweezers, cleaned area with alcohol, and then was able to remove the head by probing with the tweezer.  Denied any itching, rash, redness, or fever.  Home care instructions given per protocol.  Advised to continue to closely monitor the area, and report any worsening of symptoms.  Last Tetanus was 05/2015.  Pt. Verb. Understanding.  Agreed with plan.   Reason for Disposition . Tick bite with no complications  Answer Assessment - Initial Assessment Questions 1. TYPE of TICK: "Is it a wood tick or a deer tick?" If unsure, ask: "What size was the tick?" "Did it look more like a watermelon seed or a poppy seed?"      Unsure; felt it was very tiny.  2. LOCATION: "Where is the tick bite located?"      abdomen 3. ONSET: "How long do you think the tick was attached before you removed it?" (Hours or days)      Was trimming trees yesterday about 10:00 AM  4. TETANUS: "When was the last tetanus booster?"      Unsure; Immunization record shows 05/2015 5. PREGNANCY: "Is there any chance you are pregnant?" "When was your last menstrual period?"    Has had tubal ligation  Protocols used: TICK BITE-A-AH

## 2019-11-05 ENCOUNTER — Ambulatory Visit: Payer: Self-pay

## 2019-11-05 ENCOUNTER — Other Ambulatory Visit: Payer: Self-pay | Admitting: Nurse Practitioner

## 2019-11-05 ENCOUNTER — Ambulatory Visit: Payer: Self-pay | Admitting: *Deleted

## 2019-11-05 ENCOUNTER — Telehealth: Payer: Self-pay | Admitting: Nurse Practitioner

## 2019-11-05 DIAGNOSIS — U071 COVID-19: Secondary | ICD-10-CM

## 2019-11-05 NOTE — Telephone Encounter (Signed)
Patient called and requested infusion clinic for [facility] infusion.  NT call and left VM with patient information for clinic. Patient is aware and will wait for call back.

## 2019-11-05 NOTE — Telephone Encounter (Signed)
Sore throat, headache, low grade fever, chest tightness rated at 3 last night-improved today. Symptoms began 2 days ago, at home diagnostic test positive yesterday. Calling for guidance of symptoms. Mild occasional cough at this time no phlegm. Chest feels better today. Monitor for changes with her breathing or worsening of chest tightness while up and active. May take OTC for fever/aches. Increase water intake for hydration. Disinfected common household areas. Remain in isolation area and away from others until designated date we discussed. If no fever and no worsening respiratory symptoms for 24-48 hours at that date you may end isolation. May have vaccine 14 days after no symptoms.   Reason for Disposition . [1] COVID-19 diagnosed by positive lab test AND [2] mild symptoms (e.g., cough, fever, others) AND [3] no complications or SOB  Answer Assessment - Initial Assessment Questions 1. COVID-19 DIAGNOSIS: "Who made your Coronavirus (COVID-19) diagnosis?" "Was it confirmed by a positive lab test?" If not diagnosed by a HCP, ask "Are there lots of cases (community spread) where you live?" (See public health department website, if unsure)     Tuesday, 11/04/19 home diagnostic testing 2. COVID-19 EXPOSURE: "Was there any known exposure to COVID before the symptoms began?" CDC Definition of close contact: within 6 feet (2 meters) for a total of 15 minutes or more over a 24-hour period.      unknown 3. ONSET: "When did the COVID-19 symptoms start?"      2 days ago, Monday 4. WORST SYMPTOM: "What is your worst symptom?" (e.g., cough, fever, shortness of breath, muscle aches)     Headache, sore throat, chest tightness rated a 3 last night.  5. COUGH: "Do you have a cough?" If Yes, ask: "How bad is the cough?"       occasional cough 6. FEVER: "Do you have a fever?" If Yes, ask: "What is your temperature, how was it measured, and when did it start?"     Low grade 7. RESPIRATORY STATUS: "Describe your  breathing?" (e.g., shortness of breath, wheezing, unable to speak)      Tight while up and moving last night. Improved today.  8. BETTER-SAME-WORSE: "Are you getting better, staying the same or getting worse compared to yesterday?"  If getting worse, ask, "In what way?"     Better 9. HIGH RISK DISEASE: "Do you have any chronic medical problems?" (e.g., asthma, heart or lung disease, weak immune system, obesity, etc.)     none 10. PREGNANCY: "Is there any chance you are pregnant?" "When was your last menstrual period?"      na 11. OTHER SYMPTOMS: "Do you have any other symptoms?"  (e.g., chills, fatigue, headache, loss of smell or taste, muscle pain, sore throat; new loss of smell or taste especially support the diagnosis of COVID-19)      Headache and sore throat  Protocols used: CORONAVIRUS (COVID-19) DIAGNOSED OR SUSPECTED-A-AH

## 2019-11-05 NOTE — Telephone Encounter (Signed)
Called to discuss with [name] about Covid symptoms and the use of casirivimab/imdevimab, a combination monoclonal antibody infusion for those with mild to moderate Covid symptoms and at a high risk of hospitalization.     Pt is qualified for this infusion at the [facility] infusion center due to co-morbid conditions (BMI >30 and smoker). Symptom onset of fever, chills, body aches, shortness of breath, cough, congestion 11/02/19.  Verbalized understanding of appointment and infusion. Scheduled for 11/06/19 @ 1400.     Patient Active Problem List   Diagnosis Date Noted  . Anxiety 12/11/2018  . Psychophysiologic insomnia 12/11/2018  . Chronic cystitis 12/24/2014  . 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 History of recurrent UTIs 10/12/2011    Willette Alma, AGPCNP-BC

## 2019-11-05 NOTE — Progress Notes (Signed)
I connected by phone with [name] on 11/05/2019 at 8:03 PM to discuss the potential use of a new treatment for mild to moderate COVID-19 viral infection in non-hospitalized patients.  This patient is a 39 y.o. female that meets the FDA criteria for Emergency Use Authorization of COVID monoclonal antibody casirivimab/imdevimab.  Has a (+) direct SARS-CoV-2 viral test result  Has mild or moderate COVID-19   Is NOT hospitalized due to COVID-19  Is within 10 days of symptom onset  Has at least one of the high risk factor(s) for progression to severe COVID-19 and/or hospitalization as defined in EUA.  Specific high risk criteria : BMI > 25 and smoker    I have spoken and communicated the following to the patient or parent/caregiver regarding COVID monoclonal antibody treatment:  1. FDA has authorized the emergency use for the treatment of mild to moderate COVID-19 in adults and pediatric patients with positive results of direct SARS-CoV-2 viral testing who are 37 years of age and older weighing at least 40 kg, and who are at high risk for progressing to severe COVID-19 and/or hospitalization.  2. The significant known and potential risks and benefits of COVID monoclonal antibody, and the extent to which such potential risks and benefits are unknown.  3. Information on available alternative treatments and the risks and benefits of those alternatives, including clinical trials.  4. Patients treated with COVID monoclonal antibody should continue to self-isolate and use infection control measures (e.g., wear mask, isolate, social distance, avoid sharing personal items, clean and disinfect "high touch" surfaces, and frequent handwashing) according to CDC guidelines.   5. The patient or parent/caregiver has the option to accept or refuse COVID monoclonal antibody treatment.  After reviewing this information with the patient, The patient agreed to proceed with receiving casirivimab\imdevimab  infusion and will be provided a copy of the Fact sheet prior to receiving the infusion. [name] 11/05/2019 8:03 PM

## 2020-01-05 ENCOUNTER — Other Ambulatory Visit: Payer: Self-pay | Admitting: Neurology

## 2020-01-05 DIAGNOSIS — G35 Multiple sclerosis: Secondary | ICD-10-CM

## 2020-01-18 ENCOUNTER — Ambulatory Visit: Payer: Commercial Managed Care - PPO

## 2020-01-27 ENCOUNTER — Ambulatory Visit: Payer: Commercial Managed Care - PPO

## 2020-06-30 ENCOUNTER — Other Ambulatory Visit: Payer: Self-pay

## 2020-06-30 ENCOUNTER — Ambulatory Visit (INDEPENDENT_AMBULATORY_CARE_PROVIDER_SITE_OTHER): Payer: Commercial Managed Care - PPO | Admitting: Advanced Practice Midwife

## 2020-06-30 ENCOUNTER — Encounter: Payer: Self-pay | Admitting: Advanced Practice Midwife

## 2020-06-30 VITALS — BP 123/79 | HR 77 | Ht 67.0 in | Wt 170.0 lb

## 2020-06-30 DIAGNOSIS — Z01419 Encounter for gynecological examination (general) (routine) without abnormal findings: Secondary | ICD-10-CM | POA: Diagnosis not present

## 2020-06-30 DIAGNOSIS — R002 Palpitations: Secondary | ICD-10-CM

## 2020-06-30 DIAGNOSIS — Z9851 Tubal ligation status: Secondary | ICD-10-CM | POA: Diagnosis not present

## 2020-06-30 DIAGNOSIS — T50B95A Adverse effect of other viral vaccines, initial encounter: Secondary | ICD-10-CM

## 2020-06-30 DIAGNOSIS — Z8349 Family history of other endocrine, nutritional and metabolic diseases: Secondary | ICD-10-CM

## 2020-06-30 NOTE — Patient Instructions (Signed)
Preventive Care 84-42 Years Old, Female Preventive care refers to lifestyle choices and visits with your health care provider that can promote health and wellness. This includes:  A yearly physical exam. This is also called an annual wellness visit.  Regular dental and eye exams.  Immunizations.  Screening for certain conditions.  Healthy lifestyle choices, such as: ? Eating a healthy diet. ? Getting regular exercise. ? Not using drugs or products that contain nicotine and tobacco. ? Limiting alcohol use. What can I expect for my preventive care visit? Physical exam Your health care provider will check your:  Height and weight. These may be used to calculate your BMI (body mass index). BMI is a measurement that tells if you are at a healthy weight.  Heart rate and blood pressure.  Body temperature.  Skin for abnormal spots. Counseling Your health care provider may ask you questions about your:  Past medical problems.  Family's medical history.  Alcohol, tobacco, and drug use.  Emotional well-being.  Home life and relationship well-being.  Sexual activity.  Diet, exercise, and sleep habits.  Work and work Statistician.  Access to firearms.  Method of birth control.  Menstrual cycle.  Pregnancy history. What immunizations do I need? Vaccines are usually given at various ages, according to a schedule. Your health care provider will recommend vaccines for you based on your age, medical history, and lifestyle or other factors, such as travel or where you work.   What tests do I need? Blood tests  Lipid and cholesterol levels. These may be checked every 5 years, or more often if you are over 3 years old.  Hepatitis C test.  Hepatitis B test. Screening  Lung cancer screening. You may have this screening every year starting at age 73 if you have a 30-pack-year history of smoking and currently smoke or have quit within the past 15 years.  Colorectal cancer  screening. ? All adults should have this screening starting at age 52 and continuing until age 17. ? Your health care provider may recommend screening at age 49 if you are at increased risk. ? You will have tests every 1-10 years, depending on your results and the type of screening test.  Diabetes screening. ? This is done by checking your blood sugar (glucose) after you have not eaten for a while (fasting). ? You may have this done every 1-3 years.  Mammogram. ? This may be done every 1-2 years. ? Talk with your health care provider about when you should start having regular mammograms. This may depend on whether you have a family history of breast cancer.  BRCA-related cancer screening. This may be done if you have a family history of breast, ovarian, tubal, or peritoneal cancers.  Pelvic exam and Pap test. ? This may be done every 3 years starting at age 10. ? Starting at age 11, this may be done every 5 years if you have a Pap test in combination with an HPV test. Other tests  STD (sexually transmitted disease) testing, if you are at risk.  Bone density scan. This is done to screen for osteoporosis. You may have this scan if you are at high risk for osteoporosis. Talk with your health care provider about your test results, treatment options, and if necessary, the need for more tests. Follow these instructions at home: Eating and drinking  Eat a diet that includes fresh fruits and vegetables, whole grains, lean protein, and low-fat dairy products.  Take vitamin and mineral supplements  as recommended by your health care provider.  Do not drink alcohol if: ? Your health care provider tells you not to drink. ? You are pregnant, may be pregnant, or are planning to become pregnant.  If you drink alcohol: ? Limit how much you have to 0-1 drink a day. ? Be aware of how much alcohol is in your drink. In the [place] one drink equals one 12 oz bottle of beer (355 mL), one 5 oz glass of  wine (148 mL), or one 1 oz glass of hard liquor (44 mL).   Lifestyle  Take daily care of your teeth and gums. Brush your teeth every morning and night with fluoride toothpaste. Floss one time each day.  Stay active. Exercise for at least 30 minutes 5 or more days each week.  Do not use any products that contain nicotine or tobacco, such as cigarettes, e-cigarettes, and chewing tobacco. If you need help quitting, ask your health care provider.  Do not use drugs.  If you are sexually active, practice safe sex. Use a condom or other form of protection to prevent STIs (sexually transmitted infections).  If you do not wish to become pregnant, use a form of birth control. If you plan to become pregnant, see your health care provider for a prepregnancy visit.  If told by your health care provider, take low-dose aspirin daily starting at age 50.  Find healthy ways to cope with stress, such as: ? Meditation, yoga, or listening to music. ? Journaling. ? Talking to a trusted person. ? Spending time with friends and family. Safety  Always wear your seat belt while driving or riding in a vehicle.  Do not drive: ? If you have been drinking alcohol. Do not ride with someone who has been drinking. ? When you are tired or distracted. ? While texting.  Wear a helmet and other protective equipment during sports activities.  If you have firearms in your house, make sure you follow all gun safety procedures. What's next?  Visit your health care provider once a year for an annual wellness visit.  Ask your health care provider how often you should have your eyes and teeth checked.  Stay up to date on all vaccines. This information is not intended to replace advice given to you by your health care provider. Make sure you discuss any questions you have with your health care provider. Document Revised: 12/09/2019 Document Reviewed: 11/15/2017 Elsevier Patient Education  2021 [employer]  

## 2020-06-30 NOTE — Progress Notes (Signed)
Pt did not get 2nd Covid Vaccine. Pt reports heart palpitations  after first covid vaccine.

## 2020-06-30 NOTE — Progress Notes (Signed)
GYNECOLOGY ANNUAL PREVENTATIVE CARE ENCOUNTER NOTE  History:     [name] is a 40 y.o. [phone] female here for a routine annual gynecologic exam.  Current complaints: chronic low sex drive, would like to check Thyroid due to history of abnormal thyroid in her father and his sister.  Patient is s/p COVID vaccine fall 2021. She experienced new onset heart palpitations the day after administration. She has experienced subsequent identical episodes periodically, as recently as two weeks ago during church. Denies history of anxiety. Denies chest pain, SOB, weakness, syncope. Denies personal or family history of acute cardiac events.  Patient lives with her husband and children. Works in [employer]. Non-smoker, wears seat belts. Denies SI, HI, IPV.  Denies abnormal vaginal bleeding, discharge, pelvic pain, problems with intercourse or other gynecologic concerns.    Gynecologic History Patient's last menstrual period was 06/18/2020 (approximate). Contraception: tubal ligation Regular periods, 28-31 days cycle, 3 days of bleeding, no concerns Last Pap: 09/2017 . Results were: normal with negative HPV Last mammogram: No history.  Obstetric History OB History  Gravida Para Term Preterm AB Living  _0 SAB IAB Ectopic Multiple Live Births  2     0 3    # Outcome Date GA Lbr Len/2nd Weight Sex Delivery Anes PTL Lv  6 Term 09/02/15 [redacted]w[redacted]d 9 lb 2.4 oz (4.15 kg) F CS-LTranv Spinal  LIV  5 Term 11/20/08 [id number] lb 7 oz (4.281 kg) M Vag-Spont  N LIV     Birth Comments: Induced for macrosomia at 39 weeks  4 Term 04/2006 [id number] lb 6 oz (4.252 kg) M Vag-Spont  N LIV     Complications: Shoulder Dystocia  3 Gravida           2 SAB           1 SAB             Past Medical History:  Diagnosis Date  . History of recurrent UTIs 10/12/2011  . Kidney stones     Past Surgical History:  Procedure Laterality Date  . CESAREAN SECTION N/A 09/02/2015   Procedure: CESAREAN  SECTION;  Surgeon: UgoOsborne OmanD;  [facility] [place] [facility];  Laterality: N/A;  . DILATION AND CURETTAGE OF UTERUS     X2  . LEEP    . TUBAL LIGATION  09/02/2015   Procedure: BILATERAL TUBAL LIGATION;  Surgeon: UgoOsborne OmanD;  [facility] [place];;  . WISDOM TOOTH EXTRACTION      Current Outpatient Medications on File Prior to Visit  Medication Sig Dispense Refill  . Melatonin 5 MG CAPS Take 5 mg by mouth as needed.    . Multiple Vitamin (MULTIVITAMIN) tablet Take 1 tablet by mouth daily.    . hydrOXYzine (ATARAX/VISTARIL) 25 MG tablet Take 1-2 tablets (25-50 mg total) by mouth every 6 (six) hours as needed for anxiety (insomnia). (Patient not taking: Reported on 06/30/2020) 30 tablet 2  . ibuprofen (ADVIL,MOTRIN) 600 MG tablet Take 1 tablet (600 mg total) by mouth every 6 (six) hours. (Patient not taking: Reported on 06/30/2020) 60 tablet 0  . PARoxetine (PAXIL) 10 MG tablet TAKE 1 TABLET(10 MG) BY MOUTH DAILY (Patient not taking: Reported on 06/30/2020) 30 tablet 0   No current facility-administered medications on file prior to visit.    Allergies  Allergen Reactions  . Mustard Seed Other (  See Comments)    Gi upset  . Other     Peppercorn    Social History:  reports that she has quit smoking. She quit after 0.25 years of use. She has never used smokeless tobacco. She reports current alcohol use. She reports that she does not use drugs.  Family History  Problem Relation Age of Onset  . Parkinsonism Mother   . Hypertension Father        history of hypertension  . Prostate cancer Father   . Hyperlipidemia Father   . Diabetes Maternal Grandfather        diabetes 2  . Healthy Brother   . Healthy Brother     The following portions of the patient's history were reviewed and updated as appropriate: allergies, current medications, past family history, past medical history, past social history, past surgical history  and problem list.  Review of Systems Pertinent items noted in HPI and remainder of comprehensive ROS otherwise negative.  Physical Exam:  BP 123/79   Pulse 77   Ht _0  (1.702 m)   Wt 170 lb (77.1 kg)   LMP 06/18/2020 (Approximate)   BMI 26.63 kg/m  CONSTITUTIONAL: Well-developed, well-nourished female in no acute distress.  HENT:  Normocephalic, atraumatic, External right and left ear normal.  EYES: Conjunctivae and EOM are normal. Pupils are equal, round, and reactive to light. No scleral icterus.  NECK: Normal range of motion, supple, no masses.  Normal thyroid.  SKIN: Skin is warm and dry. No rash noted. Not diaphoretic. No erythema. No pallor. MUSCULOSKELETAL: Normal range of motion. No tenderness.  No cyanosis, clubbing, or edema. NEUROLOGIC: Alert and oriented to person, place, and time. Normal reflexes, muscle tone coordination.  PSYCHIATRIC: Normal mood and affect. Normal behavior. Normal judgment and thought content. CARDIOVASCULAR: Normal heart rate noted, regular rhythm RESPIRATORY: Clear to auscultation bilaterally. Effort and breath sounds normal, no problems with respiration noted. BREASTS: Symmetric in size. No masses, tenderness, skin changes, nipple drainage, or lymphadenopathy bilaterally. Performed in the presence of a chaperone. ABDOMEN: Soft, no distention noted.  No tenderness, rebound or guarding.  PELVIC: Deferred   Assessment and Plan:    1. Well woman exam with routine gynecological exam - Discussed 5 year co-testing for pap. Pt desires this schedule. Pap not collected - CBC - Comp Met (CMET) - Thyroid Panel With TSH - VITAMIN D 25 Hydroxy (Vit-D Deficiency, Fractures) - Hemoglobin A1c - Lipid panel - MM 3D SCREEN BREAST BILATERAL; Future  2. Hx of tubal ligation   3. Family history of thyroid disorder - Hypo and Hyperthyroidism - Patient currently without severe symptoms  4. Heart palpitations - Remote from COVID vaccine - Offered ambulatory  referral to Cardiology. Declined but will discuss with family and contact office if she changes her mind  5. Adverse reaction to COVID-19 vaccine   Mammogram scheduled Routine preventative health maintenance measures emphasized. Please refer to After Visit Summary for other counseling recommendations.     Mallie Snooks, MSN, CNM Certified Nurse Midwife, Product/process development scientist for [employer], [place]

## 2020-07-01 LAB — CBC
Hematocrit: 43.2 % (ref 34.0–46.6)
Hemoglobin: 14.3 g/dL (ref 11.1–15.9)
MCH: 29.7 pg (ref 26.6–33.0)
MCHC: 33.1 g/dL (ref 31.5–35.7)
MCV: 90 fL (ref 79–97)
Platelets: 348 10*3/uL (ref 150–450)
RBC: 4.81 x10E6/uL (ref 3.77–5.28)
RDW: 11.3 % — ABNORMAL LOW (ref 11.7–15.4)
WBC: 6.8 10*3/uL (ref 3.4–10.8)

## 2020-07-01 LAB — THYROID PANEL WITH TSH
Free Thyroxine Index: 1.9 (ref 1.2–4.9)
T3 Uptake Ratio: 30 % (ref 24–39)
T4, Total: 6.3 ug/dL (ref 4.5–12.0)
TSH: 1.43 u[IU]/mL (ref 0.450–4.500)

## 2020-07-01 LAB — LIPID PANEL
Chol/HDL Ratio: 2.8 ratio (ref 0.0–4.4)
Cholesterol, Total: 168 mg/dL (ref 100–199)
HDL: 59 mg/dL (ref >39)
LDL Chol Calc (NIH): 99 mg/dL (ref 0–99)
Triglycerides: 48 mg/dL (ref 0–149)
VLDL Cholesterol Cal: 10 mg/dL (ref 5–40)

## 2020-07-01 LAB — COMPREHENSIVE METABOLIC PANEL
ALT: 13 IU/L (ref 0–32)
AST: 13 IU/L (ref 0–40)
Albumin/Globulin Ratio: 1.9 (ref 1.2–2.2)
Albumin: 4.8 g/dL (ref 3.8–4.8)
Alkaline Phosphatase: 47 IU/L (ref 44–121)
BUN/Creatinine Ratio: 13 (ref 9–23)
BUN: 10 mg/dL (ref 6–24)
Bilirubin Total: 0.4 mg/dL (ref 0.0–1.2)
CO2: 20 mmol/L (ref 20–29)
Calcium: 9.5 mg/dL (ref 8.7–10.2)
Chloride: 101 mmol/L (ref 96–106)
Creatinine, Ser: 0.79 mg/dL (ref 0.57–1.00)
Globulin, Total: 2.5 g/dL (ref 1.5–4.5)
Glucose: 98 mg/dL (ref 65–99)
Potassium: 4.2 mmol/L (ref 3.5–5.2)
Sodium: 137 mmol/L (ref 134–144)
Total Protein: 7.3 g/dL (ref 6.0–8.5)
eGFR: 96 mL/min/{1.73_m2} (ref >59)

## 2020-07-01 LAB — HEMOGLOBIN A1C
Est. average glucose Bld gHb Est-mCnc: 88 mg/dL
Hgb A1c MFr Bld: 4.7 % — ABNORMAL LOW (ref 4.8–5.6)

## 2020-07-01 LAB — VITAMIN D 25 HYDROXY (VIT D DEFICIENCY, FRACTURES): Vit D, 25-Hydroxy: 30.8 ng/mL (ref 30.0–100.0)

## 2020-07-05 ENCOUNTER — Other Ambulatory Visit: Payer: Self-pay

## 2020-07-05 ENCOUNTER — Ambulatory Visit
Admission: RE | Admit: 2020-07-05 | Discharge: 2020-07-05 | Disposition: A | Discharge status: Home / Self Care | Payer: Commercial Managed Care - PPO | Source: Ambulatory Visit | Attending: Advanced Practice Midwife | Admitting: Advanced Practice Midwife

## 2020-07-05 DIAGNOSIS — Z01419 Encounter for gynecological examination (general) (routine) without abnormal findings: Secondary | ICD-10-CM

## 2020-07-06 ENCOUNTER — Other Ambulatory Visit: Payer: Self-pay | Admitting: Advanced Practice Midwife

## 2020-07-06 DIAGNOSIS — R928 Other abnormal and inconclusive findings on diagnostic imaging of breast: Secondary | ICD-10-CM

## 2020-07-26 ENCOUNTER — Ambulatory Visit
Admission: RE | Admit: 2020-07-26 | Discharge: 2020-07-26 | Disposition: A | Discharge status: Home / Self Care | Payer: Commercial Managed Care - PPO | Source: Ambulatory Visit | Attending: Advanced Practice Midwife | Admitting: Advanced Practice Midwife

## 2020-07-26 ENCOUNTER — Other Ambulatory Visit: Payer: Self-pay

## 2020-07-26 DIAGNOSIS — R928 Other abnormal and inconclusive findings on diagnostic imaging of breast: Secondary | ICD-10-CM

## 2020-07-28 ENCOUNTER — Other Ambulatory Visit: Payer: Commercial Managed Care - PPO

## 2021-05-14 ENCOUNTER — Encounter: Payer: Self-pay | Admitting: Radiology

## 2021-09-08 ENCOUNTER — Other Ambulatory Visit: Payer: Commercial Managed Care - PPO

## 2021-10-24 ENCOUNTER — Ambulatory Visit (INDEPENDENT_AMBULATORY_CARE_PROVIDER_SITE_OTHER): Payer: Commercial Managed Care - PPO | Admitting: Family Medicine

## 2021-10-24 VITALS — BP 122/82 | HR 85 | Ht 67.0 in | Wt 199.0 lb

## 2021-10-24 DIAGNOSIS — G43509 Persistent migraine aura without cerebral infarction, not intractable, without status migrainosus: Secondary | ICD-10-CM | POA: Diagnosis not present

## 2021-10-24 DIAGNOSIS — Z8349 Family history of other endocrine, nutritional and metabolic diseases: Secondary | ICD-10-CM | POA: Diagnosis not present

## 2021-10-24 DIAGNOSIS — Z01419 Encounter for gynecological examination (general) (routine) without abnormal findings: Secondary | ICD-10-CM

## 2021-10-24 DIAGNOSIS — F419 Anxiety disorder, unspecified: Secondary | ICD-10-CM

## 2021-10-24 DIAGNOSIS — N926 Irregular menstruation, unspecified: Secondary | ICD-10-CM | POA: Diagnosis not present

## 2021-10-24 NOTE — Progress Notes (Signed)
Having more irregular cycles, weight gain-25lb over last 2 months weeks, and feeling anxious and more emotional More optic migraines  Discuss getting labs drawn

## 2021-10-24 NOTE — Progress Notes (Signed)
GYNECOLOGY ANNUAL PREVENTATIVE CARE ENCOUNTER NOTE  Subjective:   [name] is a 41 y.o. [phone] female here for a routine annual gynecologic exam.  Current complaints: irregular cycles. .     Menses are now regular but skipped cycle x 1 month and then had 2 cycles for several months. Last two months have been regular again. Reports cycles are heavier with many clots.   Denies  discharge, pelvic pain, problems with intercourse or other gynecologic concerns.    Gynecologic History Patient's last menstrual period was 09/28/2021 (approximate). Contraception: tubal ligation Last Pap: 2019. Results were: normal (NIL/HPV NEG) Last mammogram: 2022  Health Maintenance Due  Topic Date Due   Hepatitis C Screening  Never done   COVID-19 Vaccine (2 - Pfizer series) 03/15/2020   PAP SMEAR-Modifier  10/01/2020   INFLUENZA VACCINE  10/18/2021    The following portions of the patient's history were reviewed and updated as appropriate: allergies, current medications, past family history, past medical history, past social history, past surgical history and problem list.  Review of Systems Pertinent items are noted in HPI.   Objective:  BP 122/82   Pulse 85   Ht 5\' 7"  (1.702 m)   Wt 199 lb (90.3 kg)   LMP 09/28/2021 (Approximate)   BMI 31.17 kg/m  CONSTITUTIONAL: Well-developed, well-nourished female in no acute distress.  HENT:  Normocephalic, atraumatic, External right and left ear normal. Oropharynx is clear and moist EYES:  No scleral icterus.  NECK: Normal range of motion, supple, no masses.  Normal thyroid.  SKIN: Skin is warm and dry. No rash noted. Not diaphoretic. No erythema. No pallor. NEUROLOGIC: Alert and oriented to person, place, and time. Normal reflexes, muscle tone coordination. No cranial nerve deficit noted. PSYCHIATRIC: Normal mood and affect. Normal behavior. Normal judgment and thought content. CARDIOVASCULAR: Normal heart rate noted, regular rhythm. 2+ distal  pulses. RESPIRATORY: Effort and breath sounds normal, no problems with respiration noted. BREASTS: Symmetric in size. No masses, skin changes, nipple drainage, or lymphadenopathy. ABDOMEN: Soft,  no distention noted.  No tenderness, rebound or guarding.  PELVIC: Normal appearing external genitalia; normal appearing vaginal mucosa and cervix.  No abnormal discharge noted.  Normal uterine size, no other palpable masses, no uterine or adnexal tenderness. Pap not indicated MUSCULOSKELETAL: Normal range of motion.   Assessment and Plan:  1) Annual gynecologic examination - Declined pap, next due in 2024:  Will follow up results of pap smear and manage accordingly. STI screening desired No.  Routine preventative health maintenance measures emphasized. Reviewed perimenopausal symptoms and management.   2) Contraception counseling: BTS/BTL  1. Well woman exam with routine gynecological exam - CBE WNL - MM 3D SCREEN BREAST BILATERAL; Future - Pap 2024  2. Persistent migraine aura without cerebral infarction and without status migrainosus, not intractable  3. Irregular periods/menstrual cycles 2025) - Discussed work up and ddx - Patient with fhx of thyroid disorders - If work up negative, recommend cycle/hormone control possibly with IUD for menorrhagia.  - TSH + free T4; Future - Hemoglobin A1c; Future - Thyroglobulin antibody; Future - Thyroid peroxidase antibody; Future - Thyroid stimulating immunoglobulin; Future  4. Family history of thyroid disease - TSH + free T4; Future - Hemoglobin A1c; Future - Thyroglobulin antibody; Future - Thyroid peroxidase antibody; Future - Thyroid stimulating immunoglobulin; Future  5. Anxiety - TSH + free T4; Future - Hemoglobin A1c; Future - Thyroglobulin antibody; Future - Thyroid peroxidase antibody; Future - Thyroid stimulating immunoglobulin; Future  Please refer to After Visit Summary for other counseling recommendations.   Return in  about 1 year (around 10/25/2022) for Yearly wellness exam.  Federico Flake, MD, MPH, ABFM Attending Physician [facility] for [facility]

## 2021-10-26 ENCOUNTER — Other Ambulatory Visit: Payer: Commercial Managed Care - PPO

## 2021-10-26 DIAGNOSIS — N926 Irregular menstruation, unspecified: Secondary | ICD-10-CM

## 2021-10-26 DIAGNOSIS — F419 Anxiety disorder, unspecified: Secondary | ICD-10-CM

## 2021-10-26 DIAGNOSIS — Z8349 Family history of other endocrine, nutritional and metabolic diseases: Secondary | ICD-10-CM

## 2021-10-29 LAB — THYROID PEROXIDASE ANTIBODY: Thyroperoxidase Ab SerPl-aCnc: 12 IU/mL (ref 0–34)

## 2021-10-29 LAB — THYROGLOBULIN ANTIBODY: Thyroglobulin Antibody: <1 IU/mL (ref 0.0–0.9)

## 2021-10-29 LAB — HEMOGLOBIN A1C
Est. average glucose Bld gHb Est-mCnc: 100 mg/dL
Hgb A1c MFr Bld: 5.1 % (ref 4.8–5.6)

## 2021-10-29 LAB — THYROID STIMULATING IMMUNOGLOBULIN: Thyroid Stim Immunoglobulin: <0.1 IU/L (ref 0.00–0.55)

## 2021-10-29 LAB — TSH+FREE T4
Free T4: 1.04 ng/dL (ref 0.82–1.77)
TSH: 1.54 u[IU]/mL (ref 0.450–4.500)

## 2021-11-22 ENCOUNTER — Ambulatory Visit
Admission: RE | Admit: 2021-11-22 | Discharge: 2021-11-22 | Disposition: A | Discharge status: Home / Self Care | Payer: Commercial Managed Care - PPO | Source: Ambulatory Visit | Attending: Family Medicine | Admitting: Family Medicine

## 2021-11-22 DIAGNOSIS — Z01419 Encounter for gynecological examination (general) (routine) without abnormal findings: Secondary | ICD-10-CM

## 2021-11-23 ENCOUNTER — Other Ambulatory Visit: Payer: Commercial Managed Care - PPO

## 2021-11-29 IMAGING — MG MM DIGITAL SCREENING BILAT W/ TOMO AND CAD
6 of 10 series · 6 of 30 positions shown · non-contrast
Comparison: None.

CLINICAL DATA: Screening.

EXAM:
DIGITAL SCREENING BILATERAL MAMMOGRAM WITH TOMOSYNTHESIS AND CAD
TECHNIQUE: Bilateral screening digital craniocaudal and mediolateral oblique
mammograms were obtained. Bilateral screening digital breast
tomosynthesis was performed. The images were evaluated with
computer-aided detection.

[L CC synth-2D]
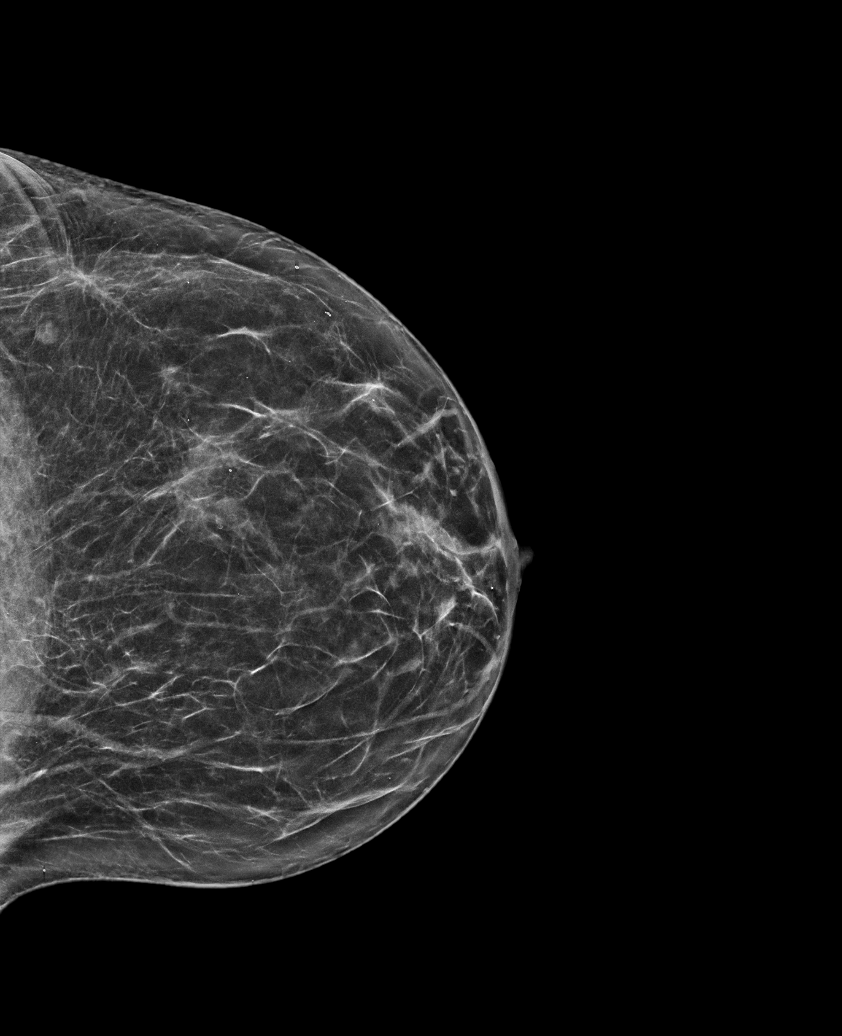

[R CC synth-2D]
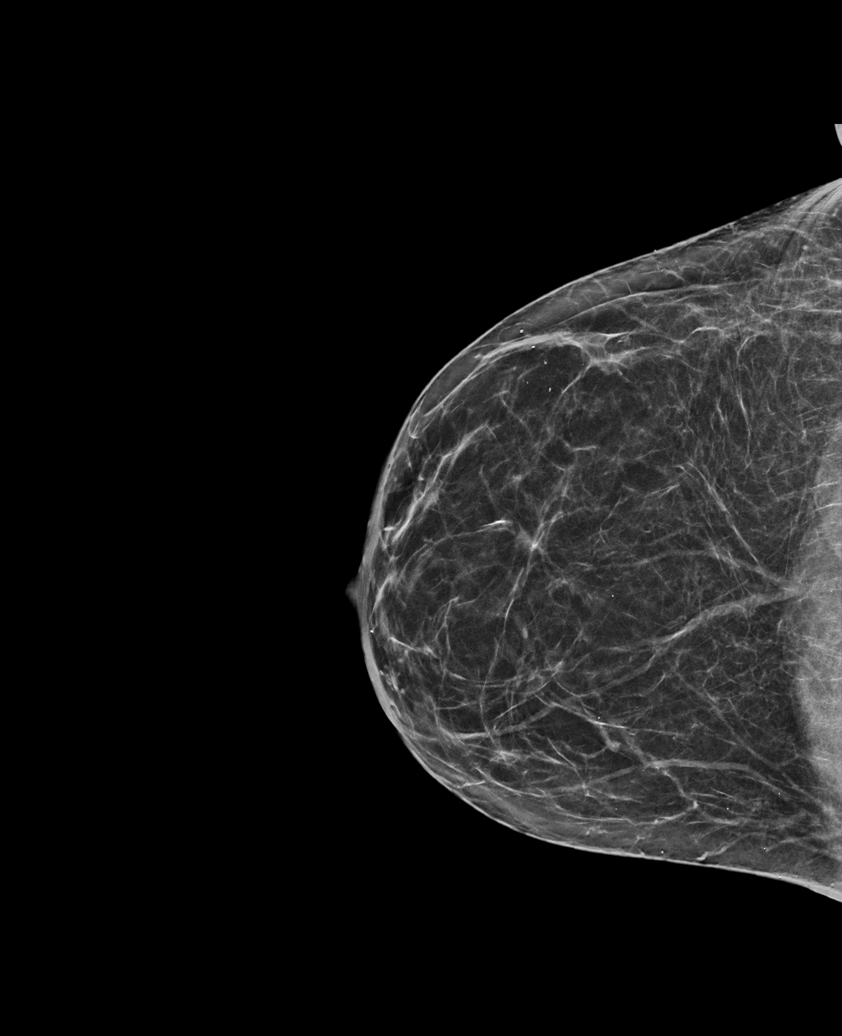

[R MLO synth-2D]
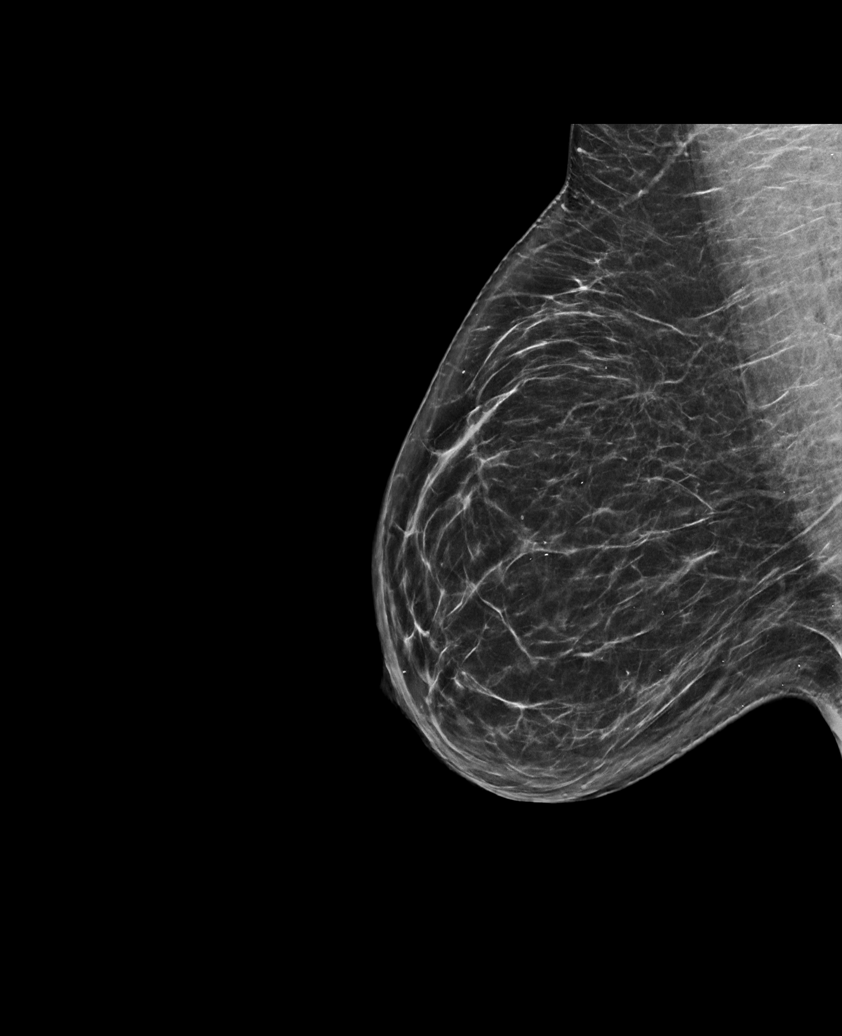

[L MLO synth-2D (1 of 2)]
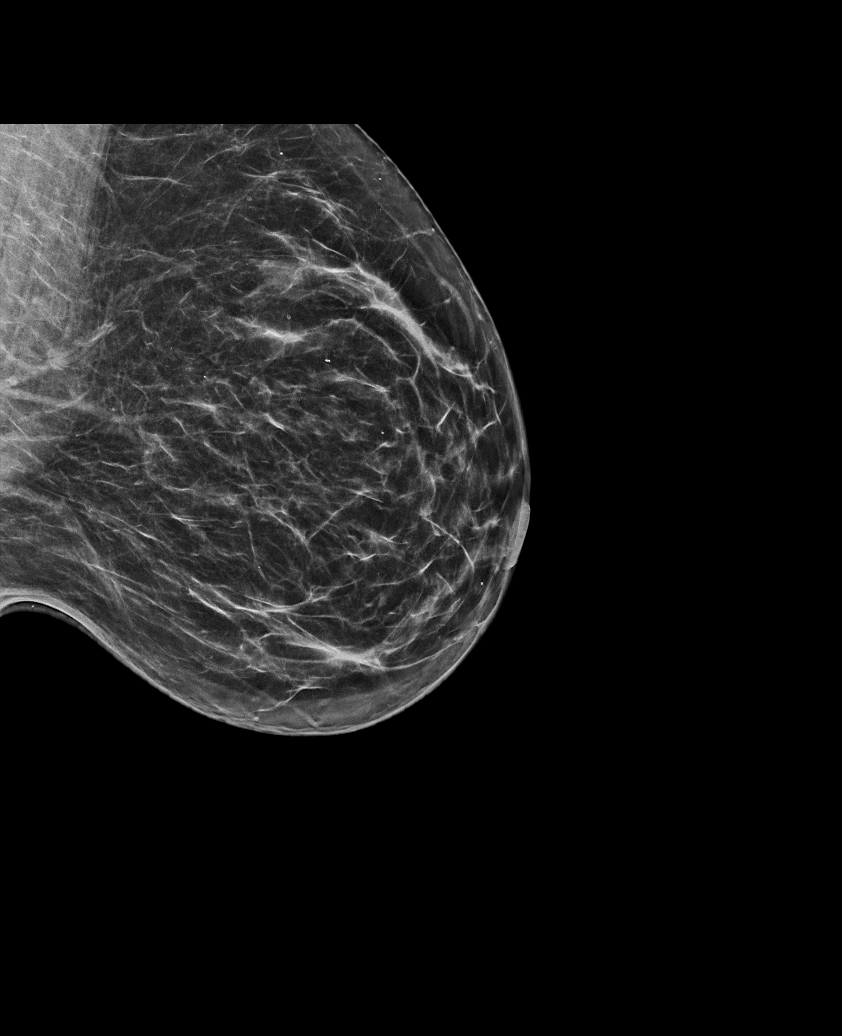

[L MLO synth-2D (2 of 2)]
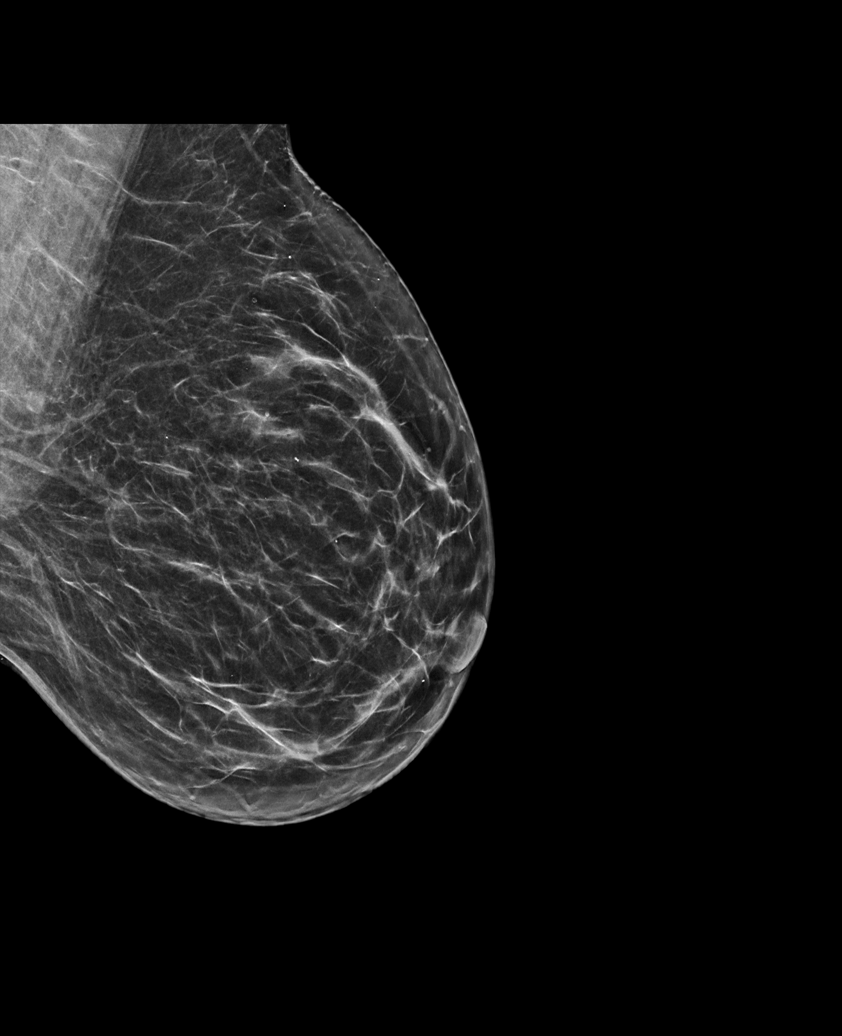

[L CC tomo · tomo slice 31/62.0]
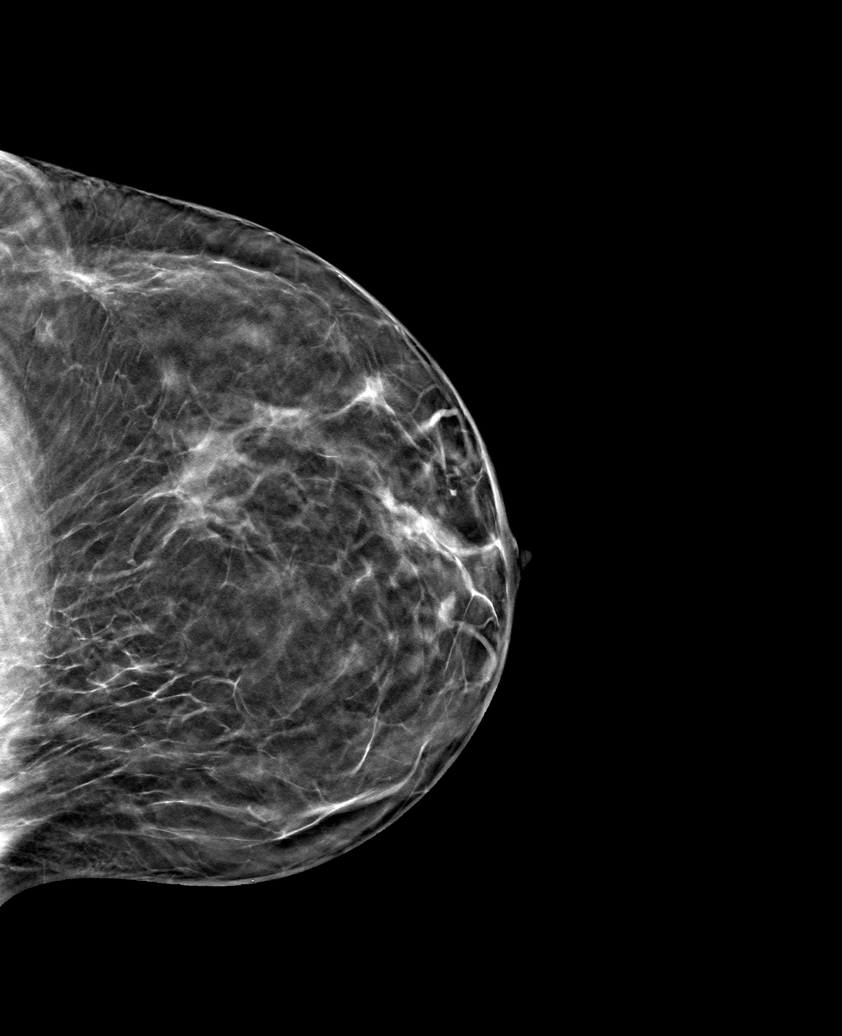

[6 of 30 positions shown; findings below may reference images not displayed]

ACR Breast Density Category b: There are scattered areas of
fibroglandular density.
FINDINGS: In the left breast, a possible mass warrants further evaluation. In
the right breast, no findings suspicious for malignancy.
IMPRESSION: Further evaluation is suggested for a possible mass in the left
breast.

RECOMMENDATION:
Diagnostic mammogram and possibly ultrasound of the left breast.
(Code:[id number])

The patient will be contacted regarding the findings, and additional
imaging will be scheduled.

BI-RADS CATEGORY  0: Incomplete. Need additional imaging evaluation
and/or prior mammograms for comparison.

## 2023-12-23 ENCOUNTER — Encounter: Payer: Self-pay | Admitting: Family Medicine
